# Patient Record
Sex: Female | Born: 1940 | Race: White | Hispanic: No | Smoking: Current every day smoker
Health system: Southern US, Community
[De-identification: ages and names within clinical notes are randomized; demographics above are authoritative.]

## PROBLEM LIST (undated history)

## (undated) DIAGNOSIS — F329 Major depressive disorder, single episode, unspecified: Secondary | ICD-10-CM

## (undated) DIAGNOSIS — M48061 Spinal stenosis, lumbar region without neurogenic claudication: Secondary | ICD-10-CM

## (undated) DIAGNOSIS — J449 Chronic obstructive pulmonary disease, unspecified: Secondary | ICD-10-CM

## (undated) DIAGNOSIS — G4730 Sleep apnea, unspecified: Secondary | ICD-10-CM

## (undated) DIAGNOSIS — M5126 Other intervertebral disc displacement, lumbar region: Secondary | ICD-10-CM

## (undated) DIAGNOSIS — M1990 Unspecified osteoarthritis, unspecified site: Secondary | ICD-10-CM

## (undated) DIAGNOSIS — J189 Pneumonia, unspecified organism: Secondary | ICD-10-CM

## (undated) DIAGNOSIS — D649 Anemia, unspecified: Secondary | ICD-10-CM

## (undated) DIAGNOSIS — K219 Gastro-esophageal reflux disease without esophagitis: Secondary | ICD-10-CM

## (undated) DIAGNOSIS — J45909 Unspecified asthma, uncomplicated: Secondary | ICD-10-CM

## (undated) DIAGNOSIS — F32A Depression, unspecified: Secondary | ICD-10-CM

## (undated) DIAGNOSIS — R42 Dizziness and giddiness: Secondary | ICD-10-CM

## (undated) DIAGNOSIS — S82891D Other fracture of right lower leg, subsequent encounter for closed fracture with routine healing: Secondary | ICD-10-CM

## (undated) DIAGNOSIS — F419 Anxiety disorder, unspecified: Secondary | ICD-10-CM

## (undated) DIAGNOSIS — F17200 Nicotine dependence, unspecified, uncomplicated: Secondary | ICD-10-CM

## (undated) DIAGNOSIS — F1020 Alcohol dependence, uncomplicated: Secondary | ICD-10-CM

## (undated) DIAGNOSIS — R270 Ataxia, unspecified: Secondary | ICD-10-CM

## (undated) HISTORY — DX: Nicotine dependence, unspecified, uncomplicated: F17.200

## (undated) HISTORY — PX: FOOT SURGERY: SHX648

## (undated) HISTORY — PX: BLADDER SURGERY: SHX569

## (undated) HISTORY — DX: Major depressive disorder, single episode, unspecified: F32.9

## (undated) HISTORY — DX: Ataxia, unspecified: R27.0

## (undated) HISTORY — DX: Unspecified osteoarthritis, unspecified site: M19.90

## (undated) HISTORY — DX: Dizziness and giddiness: R42

## (undated) HISTORY — PX: SPINAL FUSION: SHX223

## (undated) HISTORY — DX: Sleep apnea, unspecified: G47.30

## (undated) HISTORY — PX: ABDOMINAL HYSTERECTOMY: SHX81

## (undated) HISTORY — DX: Anxiety disorder, unspecified: F41.9

## (undated) HISTORY — DX: Gastro-esophageal reflux disease without esophagitis: K21.9

## (undated) HISTORY — DX: Chronic obstructive pulmonary disease, unspecified: J44.9

## (undated) HISTORY — DX: Other fracture of right lower leg, subsequent encounter for closed fracture with routine healing: S82.891D

## (undated) HISTORY — DX: Alcohol dependence, uncomplicated: F10.20

## (undated) HISTORY — PX: BACK SURGERY: SHX140

## (undated) HISTORY — DX: Depression, unspecified: F32.A

## (undated) HISTORY — PX: CERVICAL SPINE SURGERY: SHX589

---

## 2000-10-15 ENCOUNTER — Inpatient Hospital Stay (HOSPITAL_COMMUNITY): Admission: AD | Admit: 2000-10-15 | Discharge: 2000-10-16 | Payer: Self-pay | Admitting: Orthopaedic Surgery

## 2001-03-23 ENCOUNTER — Inpatient Hospital Stay (HOSPITAL_COMMUNITY): Admission: RE | Admit: 2001-03-23 | Discharge: 2001-03-24 | Payer: Self-pay | Admitting: Orthopaedic Surgery

## 2006-09-01 ENCOUNTER — Ambulatory Visit (HOSPITAL_COMMUNITY): Admission: RE | Admit: 2006-09-01 | Discharge: 2006-09-02 | Payer: Self-pay | Admitting: Orthopaedic Surgery

## 2007-10-24 IMAGING — RF DG CERVICAL SPINE 2 OR 3 VIEWS
1 series · 2 of 2 positions shown · non-contrast
Comparison: none

CLINICAL DATA: Anterior cervical spine fusion.
CERVICAL SPINE ? 2 VIEWS:

[Series 1: run · 2 of 2 slices shown]
[im 1/2]
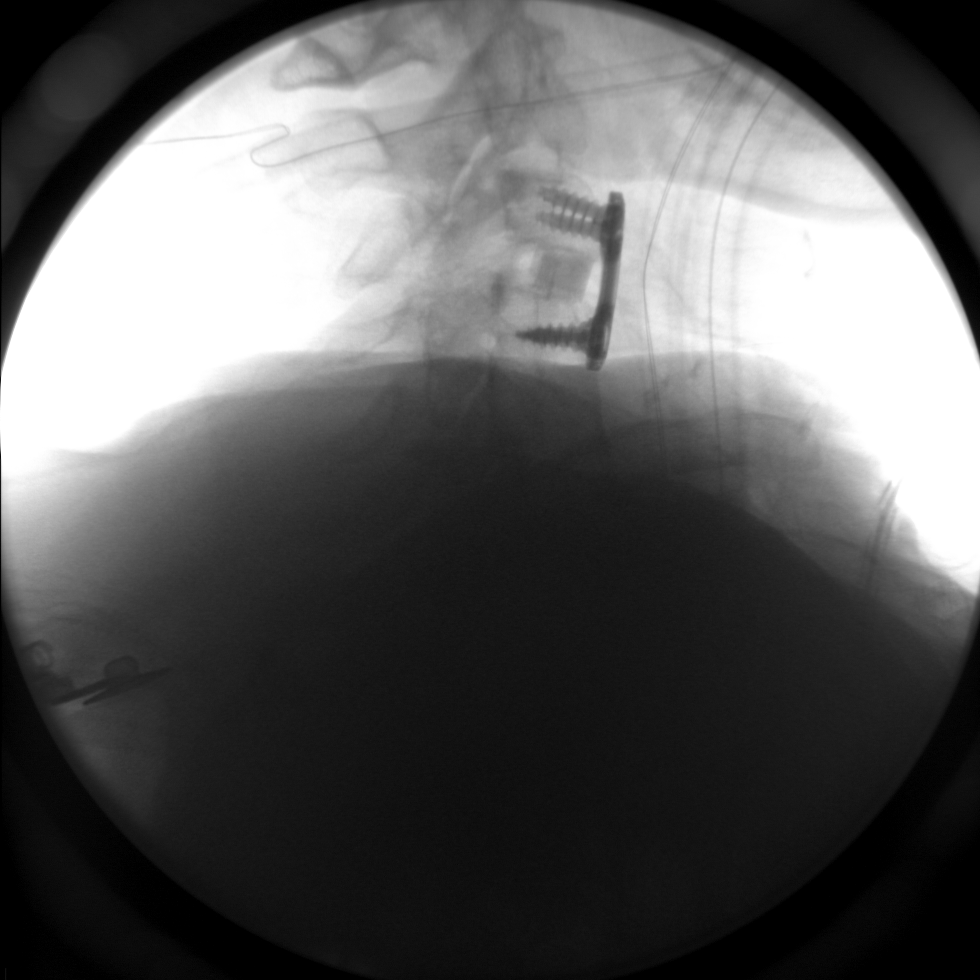
[im 2/2]
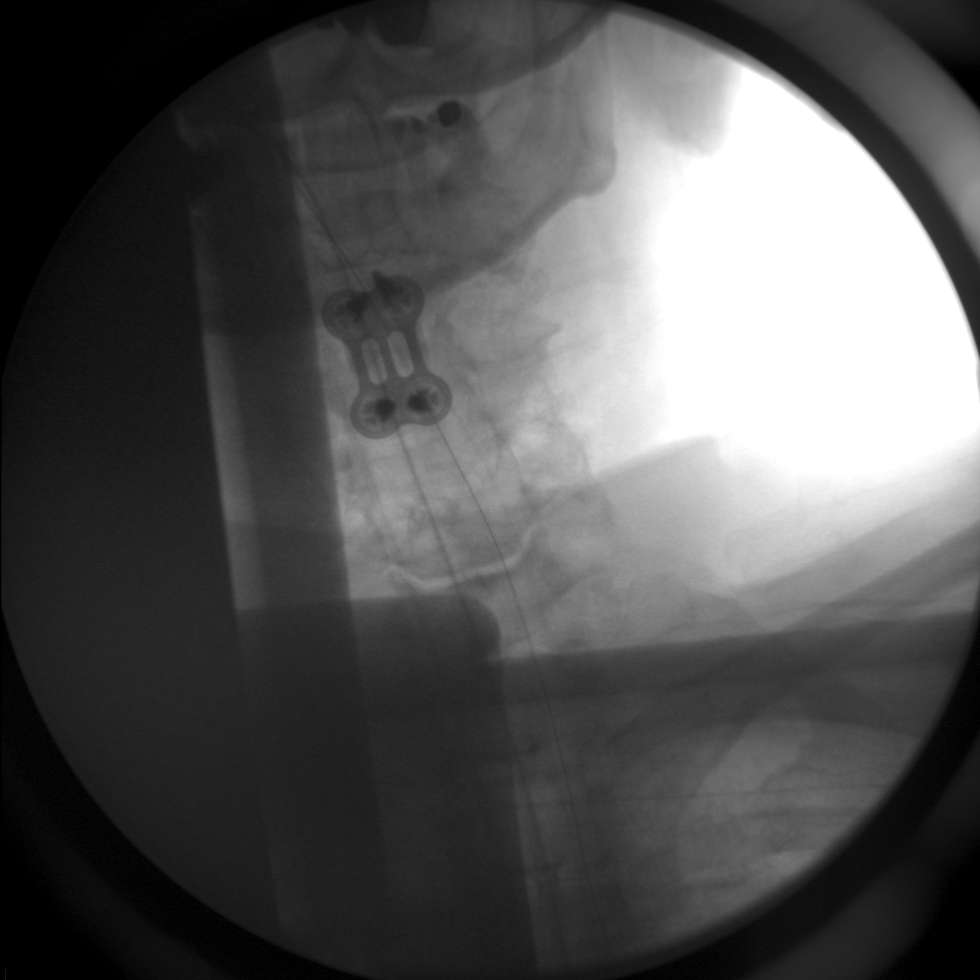

[2 of 2 positions shown; findings below may reference images not displayed]

FINDINGS: Two C-arm spot films of the cervical spine were obtained.  Anterior fusion has been performed at C3-4 with interbody fusion plug in good position.
IMPRESSION: Anterior fusion at C3-4.

## 2012-07-13 ENCOUNTER — Other Ambulatory Visit (HOSPITAL_BASED_OUTPATIENT_CLINIC_OR_DEPARTMENT_OTHER): Payer: Self-pay | Admitting: Orthopaedic Surgery

## 2012-07-13 DIAGNOSIS — M4802 Spinal stenosis, cervical region: Secondary | ICD-10-CM

## 2012-07-18 ENCOUNTER — Other Ambulatory Visit (HOSPITAL_BASED_OUTPATIENT_CLINIC_OR_DEPARTMENT_OTHER): Payer: Self-pay

## 2012-08-11 ENCOUNTER — Other Ambulatory Visit: Payer: Self-pay | Admitting: Orthopaedic Surgery

## 2012-08-11 DIAGNOSIS — R29898 Other symptoms and signs involving the musculoskeletal system: Secondary | ICD-10-CM

## 2012-08-11 DIAGNOSIS — M545 Low back pain: Secondary | ICD-10-CM

## 2012-08-12 ENCOUNTER — Other Ambulatory Visit: Payer: Self-pay

## 2012-08-16 ENCOUNTER — Ambulatory Visit
Admission: RE | Admit: 2012-08-16 | Discharge: 2012-08-16 | Disposition: A | Discharge status: Home / Self Care | Payer: Medicare Other | Source: Ambulatory Visit | Attending: Orthopaedic Surgery | Admitting: Orthopaedic Surgery

## 2012-08-16 DIAGNOSIS — R29898 Other symptoms and signs involving the musculoskeletal system: Secondary | ICD-10-CM

## 2012-08-16 DIAGNOSIS — M545 Low back pain: Secondary | ICD-10-CM

## 2012-08-16 MED ORDER — GADOBENATE DIMEGLUMINE 529 MG/ML IV SOLN
14.0000 mL | Freq: Once | INTRAVENOUS | Status: AC | PRN
  Administered 2012-08-16: 14 mL via INTRAVENOUS

## 2013-06-15 ENCOUNTER — Other Ambulatory Visit: Payer: Self-pay | Admitting: Orthopaedic Surgery

## 2013-06-15 DIAGNOSIS — M545 Low back pain: Secondary | ICD-10-CM

## 2013-06-15 DIAGNOSIS — M4807 Spinal stenosis, lumbosacral region: Secondary | ICD-10-CM

## 2013-06-24 ENCOUNTER — Other Ambulatory Visit: Payer: Medicare Other

## 2013-06-26 ENCOUNTER — Ambulatory Visit
Admission: RE | Admit: 2013-06-26 | Discharge: 2013-06-26 | Disposition: A | Discharge status: Home / Self Care | Payer: Medicare Other | Source: Ambulatory Visit | Attending: Orthopaedic Surgery | Admitting: Orthopaedic Surgery

## 2013-06-26 DIAGNOSIS — M4807 Spinal stenosis, lumbosacral region: Secondary | ICD-10-CM

## 2013-06-26 DIAGNOSIS — M545 Low back pain: Secondary | ICD-10-CM

## 2013-06-29 ENCOUNTER — Other Ambulatory Visit: Payer: Self-pay | Admitting: Orthopaedic Surgery

## 2013-06-30 ENCOUNTER — Other Ambulatory Visit (HOSPITAL_COMMUNITY): Payer: Self-pay | Admitting: Orthopaedic Surgery

## 2013-06-30 DIAGNOSIS — M79604 Pain in right leg: Secondary | ICD-10-CM

## 2013-06-30 DIAGNOSIS — I83893 Varicose veins of bilateral lower extremities with other complications: Secondary | ICD-10-CM

## 2013-07-01 ENCOUNTER — Ambulatory Visit (HOSPITAL_COMMUNITY)
Admission: RE | Admit: 2013-07-01 | Discharge: 2013-07-01 | Disposition: A | Discharge status: Home / Self Care | Payer: Medicare Other | Source: Ambulatory Visit | Attending: Orthopaedic Surgery | Admitting: Orthopaedic Surgery

## 2013-07-01 ENCOUNTER — Other Ambulatory Visit (HOSPITAL_COMMUNITY): Payer: Self-pay | Admitting: Orthopaedic Surgery

## 2013-07-01 DIAGNOSIS — M79604 Pain in right leg: Secondary | ICD-10-CM

## 2013-07-01 DIAGNOSIS — M7989 Other specified soft tissue disorders: Secondary | ICD-10-CM

## 2013-07-01 DIAGNOSIS — I83893 Varicose veins of bilateral lower extremities with other complications: Secondary | ICD-10-CM

## 2013-07-01 DIAGNOSIS — M79609 Pain in unspecified limb: Secondary | ICD-10-CM

## 2013-07-01 NOTE — Progress Notes (Signed)
VASCULAR LAB PRELIMINARY  ARTERIAL  ABI completed:WNL    RIGHT    LEFT    PRESSURE WAVEFORM  PRESSURE WAVEFORM  BRACHIAL 173 T BRACHIAL 173 T  DP   DP    AT 199  T AT 192  T  PT 185  T PT 191 T  PER   PER    GREAT TOE  NA GREAT TOE  NA    RIGHT LEFT  ABI >1.0 >1.0     Mahamud Metts, RVT 07/01/2013, 1:46 PM

## 2015-10-23 ENCOUNTER — Ambulatory Visit: Payer: 59

## 2015-11-15 ENCOUNTER — Ambulatory Visit: Payer: 59

## 2015-11-21 ENCOUNTER — Ambulatory Visit: Payer: 59

## 2017-12-09 ENCOUNTER — Ambulatory Visit: Payer: Medicare Other | Admitting: Neurology

## 2018-02-10 ENCOUNTER — Ambulatory Visit (INDEPENDENT_AMBULATORY_CARE_PROVIDER_SITE_OTHER): Payer: Medicare Other | Admitting: Orthopaedic Surgery

## 2018-02-10 ENCOUNTER — Encounter (INDEPENDENT_AMBULATORY_CARE_PROVIDER_SITE_OTHER): Payer: Self-pay | Admitting: Orthopaedic Surgery

## 2018-02-10 ENCOUNTER — Ambulatory Visit (INDEPENDENT_AMBULATORY_CARE_PROVIDER_SITE_OTHER): Payer: Medicare Other

## 2018-02-10 VITALS — BP 171/97 | HR 86 | Ht 65.25 in | Wt 154.0 lb

## 2018-02-10 DIAGNOSIS — M4807 Spinal stenosis, lumbosacral region: Secondary | ICD-10-CM

## 2018-02-10 DIAGNOSIS — G8929 Other chronic pain: Secondary | ICD-10-CM | POA: Diagnosis not present

## 2018-02-10 DIAGNOSIS — M545 Low back pain: Secondary | ICD-10-CM

## 2018-02-10 MED ORDER — DIAZEPAM 5 MG PO TABS: ORAL_TABLET | ORAL | 0 refills | Status: DC

## 2018-02-10 NOTE — Progress Notes (Signed)
Office Visit Note   Patient: Whitney Kelley           Date of Birth: 02-Nov-1940           MRN: [id number] Visit Date: 02/10/2018              Requested by: Chauncy Lean, PA-C [address] [place], [place] [address] PCP: Chauncy Lean, PA-C   Assessment & Plan: Visit Diagnoses:  1. Chronic bilateral low back pain, with sciatica presence unspecified   2. Spinal stenosis of lumbosacral region     Plan: Patient is having claudication symptoms with normal arterial exam.  In 5 to 10 minutes and will need lumbar MRI scan for evaluation.  MRI in the past showed L3-4 disc bulging and all other levels had already significantly collapsed.  Office follow-up after MRI scan.  Follow-Up Instructions: No follow-ups on file.   Orders:  Orders Placed This Encounter  Procedures  . XR Lumbar Spine 2-3 Views  . MR LUMBAR SPINE W WO CONTRAST   Meds ordered this encounter  Medications  . diazepam (VALIUM) 5 MG tablet    Sig: Take 2 tablets one hour prior to MRI and take the other with you to exam if needed.    Dispense:  3 tablet    Refill:  0      Procedures: No procedures performed   Clinical Data: No additional findings.   Subjective: Chief Complaint  Patient presents with  . Lower Back - Pain    HPI 77 year old female long-term patient last seen 2017 is here with problems with chronic low back pain.  She states is gradually getting worse she can stand very long has to sit after about 5 to 10 minutes and having trouble cooking.  She gets relief when she sits.  She states when she walks her legs are getting weak stumble.  No fever or chills.  States she had problems last year with alcohol states she is doing well with this currently.  Normal ABIs 2014 lower extremities.  Review of Systems as of COPD with long-term smoking history.  Positive for alcohol abuse in the past.  Depression, history of dizziness.  His falls.  GERD.  Previous cervical fusions.  14 point review of  systems otherwise negative as it pertains to HPI.   Objective: Vital Signs: BP (!) 171/97   Pulse 86   Ht 5' 5.25" (1.657 m)   Wt 154 lb (69.9 kg)   BMI 25.43 kg/m   Physical Exam  Constitutional: She is oriented to person, place, and time. She appears well-developed.  HENT:  Head: Normocephalic.  Right Ear: External ear normal.  Left Ear: External ear normal.  Eyes: Pupils are equal, round, and reactive to light.  Neck: No tracheal deviation present. No thyromegaly present.  Cardiovascular: Normal rate.  Pulmonary/Chest: Effort normal.  Abdominal: Soft.  Neurological: She is alert and oriented to person, place, and time.  Skin: Skin is warm and dry.  Psychiatric: She has a normal mood and affect. Her behavior is normal.    Ortho Exam patient pain with lumbar palpation sciatic notch.  Distal pulses are 2+ and symmetrical.  No pain with hip range of motion knees reach full extension no trochanteric bursal tenderness.  Sensation lower extremities are intact anterior tib EHL gastrocsoleus peroneals are strong.  No pitting edema. Specialty Comments:  No specialty comments available.  Imaging: Xr Lumbar Spine 2-3 Views  Result Date: 02/10/2018 AP lateral lumbar spine x-rays are  obtained and reviewed.  Gallbladder clips noted.  Mild lumbar curvature with pelvic obliquity.  Multilevel disc space narrowing in the lumbar region except for L3-4 level.  Facet arthropathy noted.  Negative for acute fracture. Impression: Lumbar disc degeneration multilevel.  No acute changes.    PMFS History: There are no active problems to display for this patient.  Past Medical History:  Diagnosis Date  . Acid reflux   . Alcoholism (HCC)   . Anxiety   . Arthritis   . Ataxia   . Closed fracture of right ankle with routine healing   . COPD (chronic obstructive pulmonary disease) (HCC)   . Depression   . Dizziness   . Major depressive disorder   . Sleep apnea   . Tobacco dependence     No  family history on file.  Past Surgical History:  Procedure Laterality Date  . ABDOMINAL HYSTERECTOMY    . BACK SURGERY    . BLADDER SURGERY     x3  . FOOT SURGERY    . SPINAL FUSION     cervical x 2   Social History   Occupational History  . Not on file  Tobacco Use  . Smoking status: Current Every Day Smoker  . Smokeless tobacco: Never Used  Substance and Sexual Activity  . Alcohol use: Not Currently  . Drug use: Not on file  . Sexual activity: Not on file

## 2018-02-18 ENCOUNTER — Ambulatory Visit
Admission: RE | Admit: 2018-02-18 | Discharge: 2018-02-18 | Disposition: A | Discharge status: Home / Self Care | Payer: Medicare Other | Source: Ambulatory Visit | Attending: Orthopaedic Surgery | Admitting: Orthopaedic Surgery

## 2018-02-18 DIAGNOSIS — M4807 Spinal stenosis, lumbosacral region: Secondary | ICD-10-CM

## 2018-02-18 MED ORDER — GADOBENATE DIMEGLUMINE 529 MG/ML IV SOLN
15.0000 mL | Freq: Once | INTRAVENOUS | Status: AC | PRN
  Administered 2018-02-18: 15 mL via INTRAVENOUS

## 2018-02-24 ENCOUNTER — Ambulatory Visit (INDEPENDENT_AMBULATORY_CARE_PROVIDER_SITE_OTHER): Payer: Medicare Other

## 2018-02-24 ENCOUNTER — Encounter (INDEPENDENT_AMBULATORY_CARE_PROVIDER_SITE_OTHER): Payer: Self-pay | Admitting: Orthopaedic Surgery

## 2018-02-24 ENCOUNTER — Ambulatory Visit (INDEPENDENT_AMBULATORY_CARE_PROVIDER_SITE_OTHER): Payer: Medicare Other | Admitting: Orthopaedic Surgery

## 2018-02-24 VITALS — BP 169/98 | HR 76 | Ht 65.5 in | Wt 154.0 lb

## 2018-02-24 DIAGNOSIS — G8929 Other chronic pain: Secondary | ICD-10-CM | POA: Diagnosis not present

## 2018-02-24 DIAGNOSIS — M545 Low back pain: Secondary | ICD-10-CM | POA: Diagnosis not present

## 2018-02-24 NOTE — Progress Notes (Signed)
Office Visit Note   Patient: Whitney Kelley           Date of Birth: 01/11/1941           MRN: [id number] Visit Date: 02/24/2018              Requested by: Chauncy LeanWatterson, Patrick, PA-C [address] [place], [place] [id number] PCP: Chauncy LeanWatterson, Patrick, PA-C   Assessment & Plan: Visit Diagnoses:  1. Chronic bilateral low back pain, with sciatica presence unspecified     Plan: Patient is moderate to severe stenosis at L3-4 multifactorial with large disc protrusion ligamentum hypertrophy and facet overgrowth.  She has some severe foraminal narrowing at L4-5 level which is progressed but is having neurogenic claudication symptoms and not radicular symptoms.  We discussed the multilevel degenerative changes and reviewed x-rays.  We discussed she is not likely to get any relief with an epidural since her symptoms are severe stenosis at the L3-4 level.  Plan would be single level decompression surgery.  She will need to make arrangements for someone she could stay with 4-day or 2 after the surgery.  Operative technique discussed questions elicited and answered.  She understands request to proceed.  We will have her obtain medical clearance preoperatively with her history of COPD.  MRI scan was reviewed with the patient questions were elicited and answered she request to proceed.  Copy of MRI report given to her.  Follow-Up Instructions: No follow-ups on file.   Orders:  Orders Placed This Encounter  Procedures  . XR Lumb Spine Flex&Ext Only   No orders of the defined types were placed in this encounter.     Procedures: No procedures performed   Clinical Data: No additional findings.   Subjective: Chief Complaint  Patient presents with  . Lower Back - Pain, Follow-up    MRI review    HPI 77 year old female returns with neurogenic claudication symptoms.  She can stand for 5 to 10 minutes and has to sit.  She does better leaning on a grocery cart.  She can walk 1.5 blocks without  stopping to sit.  Normal ABIs 2014.  Previous right L4-5 microdiscectomy in 2002.  She returns post MRI for evaluation of her claudication symptoms.  She has palpable pulses.  She is a long-term smoker.  Patient is taken anti-inflammatories without relief.  MRI results shows moderate to severe stenosis at L3-4 which is progressed since 2014.  She does have progression of foraminal stenosis at L4-5 on the right but has near complete disc space collapse at 4 5 and also 5 1 as well as the L2-3 level.  Patient lives by herself and states that this inhibits her ability to go to the grocery store, go out with friends to dinner, go to church etc.  Previous lumbar x-rays showed significant narrowing of all disc spaces except for the L3-4 level.  Endplate abutment at L2-3 and also L5-S1.  Greater than 50% narrowing at L4-5.  Review of Systems positive for long-term smoking history with COPD.  Positive history of alcohol abuse in the distant past.  Positive for depression history of dizziness.  Positive for GERD.  Positive C3-4 anterior cervical fusion.  14 point review of systems otherwise negative as it pertains HPI.   Objective: Vital Signs: BP (!) 169/98   Pulse 76   Ht 5' 5.5" (1.664 m)   Wt 154 lb (69.9 kg)   BMI 25.24 kg/m   Physical Exam  Constitutional: She is oriented  to person, place, and time. She appears well-developed.  HENT:  Head: Normocephalic.  Right Ear: External ear normal.  Left Ear: External ear normal.  Eyes: Pupils are equal, round, and reactive to light.  Neck: No tracheal deviation present. No thyromegaly present.  Healed anterior cervical incision good range of motion cervical spine.  Negative Spurling.  Cardiovascular: Normal rate.  Pulmonary/Chest: Effort normal.  Abdominal: Soft.  Neurological: She is alert and oriented to person, place, and time.  Skin: Skin is warm and dry.  Psychiatric: She has a normal mood and affect. Her behavior is normal.    Ortho Exam  well-healed lumbar midline incision at L4-5 level.  Patient has some discomfort with bilateral sciatic notch palpation negative straight leg raising 90 degrees normal hip range of motion.  Quads hamstrings anterior tib EHL gastrocsoleus is intact.  Lower extremity reflexes are 2+ and symmetrical.  Pedal pulses are palpable no pitting edema no venous stasis changes. Specialty Comments:  No specialty comments available.  Imaging: CLINICAL DATA:  77 year old female with chronic lumbar back pain, weakness in both legs. Prior surgery.  Creatinine was obtained on site at [facility] at [address]  Results: Creatinine 0.7 mg/dL.  EXAM: MRI LUMBAR SPINE WITHOUT AND WITH CONTRAST  TECHNIQUE: Multiplanar and multiecho pulse sequences of the lumbar spine were obtained without and with intravenous contrast.  CONTRAST:  15mL MULTIHANCE GADOBENATE DIMEGLUMINE 529 MG/ML IV SOLN  COMPARISON:  CT Abdomen and Pelvis 04/25/2016. Lumbar MRI 06/26/2013.  FINDINGS: Segmentation: Normal on the comparison CT, which is the same numbering system used on the 2014 MRI.  Alignment: Stable vertebral height and alignment since 2014, including chronic retrolisthesis of L2 on L3 and L5 on S1.  Vertebrae: No marrow edema or evidence of acute osseous abnormality. Visualized bone marrow signal is within normal limits. Intact visible sacrum and SI joints.  Conus medullaris and cauda equina: Conus extends to the L2 level. No lower spinal cord or conus signal abnormality. No abnormal intradural enhancement.  Paraspinal and other soft tissues: Negative visualized abdominal viscera. Some posterior paraspinal muscle atrophy is evident since 2014. Otherwise negative paraspinal soft tissues.  Disc levels:  T11-T12: Disc space loss since 2014 with increased bulky circumferential disc bulge. Superimposed right paracentral disc protrusion is new on series 6, image 3 since the prior  MRI. Superimposed moderate facet hypertrophy. New mild spinal and moderate right lateral recess stenosis (descending right T12 nerve level). Increased mild bilateral T11 foraminal stenosis.  T12-L1:  Mild facet hypertrophy.  L1-L2: Stable mild disc bulging and facet hypertrophy. No stenosis.  L2-L3: Chronic retrolisthesis and severe disc space loss with left eccentric circumferential disc osteophyte complex. Stable mild spinal and left greater than right lateral recess stenosis (descending L3 nerve levels). Stable mild L2 foraminal stenosis.  L3-L4: Chronic right eccentric broad-based posterior disc protrusion. Mild to moderate facet and ligament flavum hypertrophy. Epidural lipomatosis. Moderate to severe spinal stenosis (series 9, image 6). Mild to moderate right greater than left lateral recess and foraminal stenosis (descending and exiting right L3 nerve levels). This level is stable to mildly progressed.  L4-L5: Chronic postoperative changes to the right lamina. Chronic disc space loss and circumferential disc bulge with endplate spurring eccentric to the right. Moderate residual left facet and ligament flavum hypertrophy. Trace degenerative facet joint fluid is bilateral now. No spinal stenosis. Architectural distortion at the right lateral recess without lateral recess stenosis. Chronic moderate to severe right L4 neural foraminal stenosis (series 9, image 3).  This level is stable to mildly progressed.  L5-S1: Chronic retrolisthesis and severe disc space loss with circumferential disc osteophyte complex. Chronic moderate facet hypertrophy. No spinal or lateral recess stenosis. Chronic moderate to severe left and mild right L5 foraminal stenosis. This level is stable.  IMPRESSION: 1. Mild progression of chronic spinal degeneration at L3-L4 and L4-L5 since the 2014 MRI. - moderate to severe spinal stenosis at L3-L4 with lateral recess and foraminal stenosis greater  on the right. - moderate to severe right L4 foraminal stenosis at L4-L5. 2. Other lumbar levels are stable, including mild retrolisthesis with severe chronic disc and endplate degeneration at both L2-L3 and L5-S1. Associated chronic moderate to severe left L5 foraminal stenosis. 3. Progressed lower thoracic spine disc degeneration at T11-T12 since 2014. New mild spinal stenosis at that level in part due to moderate right lateral recess disc protrusion.   Electronically Signed   By: Odessa Fleming M.D.   On: 02/18/2018 10:06   PMFS History: There are no active problems to display for this patient.  Past Medical History:  Diagnosis Date  . Acid reflux   . Alcoholism (HCC)   . Anxiety   . Arthritis   . Ataxia   . Closed fracture of right ankle with routine healing   . COPD (chronic obstructive pulmonary disease) (HCC)   . Depression   . Dizziness   . Major depressive disorder   . Sleep apnea   . Tobacco dependence     No family history on file.  Past Surgical History:  Procedure Laterality Date  . ABDOMINAL HYSTERECTOMY    . BACK SURGERY    . BLADDER SURGERY     x3  . FOOT SURGERY    . SPINAL FUSION     cervical x 2   Social History   Occupational History  . Not on file  Tobacco Use  . Smoking status: Current Every Day Smoker  . Smokeless tobacco: Never Used  Substance and Sexual Activity  . Alcohol use: Not Currently  . Drug use: Not on file  . Sexual activity: Not on file

## 2018-03-11 ENCOUNTER — Telehealth (INDEPENDENT_AMBULATORY_CARE_PROVIDER_SITE_OTHER): Payer: Self-pay | Admitting: Orthopaedic Surgery

## 2018-03-11 NOTE — Telephone Encounter (Signed)
I called has stenosis, pain meds will not fix problem. She can sit when she gets claudication Sx to get relief.waiting to schedule surgery.  FYI

## 2018-03-11 NOTE — Telephone Encounter (Signed)
Patient called requesting a refill on her pain medication, she's experiencing a lot of pain. CB # [phone]

## 2018-03-11 NOTE — Telephone Encounter (Signed)
Please advise. I do not see where you prescribed pain medication in the chart. Patient is currently waiting for medical clearance to have surgery.

## 2018-03-18 ENCOUNTER — Encounter (INDEPENDENT_AMBULATORY_CARE_PROVIDER_SITE_OTHER): Payer: Self-pay | Admitting: Surgery

## 2018-03-18 ENCOUNTER — Ambulatory Visit (INDEPENDENT_AMBULATORY_CARE_PROVIDER_SITE_OTHER): Payer: Medicare Other | Admitting: Surgery

## 2018-03-18 VITALS — BP 159/104 | HR 98 | Temp 99.3°F

## 2018-03-18 DIAGNOSIS — M4807 Spinal stenosis, lumbosacral region: Secondary | ICD-10-CM | POA: Diagnosis not present

## 2018-03-18 NOTE — Progress Notes (Signed)
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  We have received preop medical/cardiac clearance from Dr. Selina CooleyBobby Witten.  Today in the clinic patient has a low-grade temp 99.3.  States that she has had some chills along with a productive cough with cream-colored sputum.  She did not have the chills or the productive cough when she was seen by her primary care physician for clearance.  History of COPD.  We will see if patient can get an appointment to see her primary care physician for recheck and they can decide if patient needs to be on an antibiotic preop.  She goes for her preop work-up July 3.

## 2018-03-18 NOTE — H&P (Signed)
Whitney Kelley is an 77 y.o. female.   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  Progressive worsening symptoms.  Failed conservative treatment.  We have received preop medical clearance from her primary care physician Dr. Selina CooleyBobby Witten.  Patient states that over the last couple weeks or so she is been having some chills along with productive cough with cream-colored sputum.  She is not having these issues at the time of her medical clearance.  Admits to having chronic exertional dyspnea.  Patient has a history of nicotine abuse and COPD.  Temp 99.3 in the office today.  Past Medical History:  Diagnosis Date  . Acid reflux   . Alcoholism (HCC)   . Anxiety   . Arthritis   . Ataxia   . Closed fracture of right ankle with routine healing   . COPD (chronic obstructive pulmonary disease) (HCC)   . Depression   . Dizziness   . Major depressive disorder   . Sleep apnea   . Tobacco dependence     Past Surgical History:  Procedure Laterality Date  . ABDOMINAL HYSTERECTOMY    . BACK SURGERY    . BLADDER SURGERY     x3  . FOOT SURGERY    . SPINAL FUSION     cervical x 2    No family history on file. Social History:  reports that she has been smoking.  She has never used smokeless tobacco. She reports that she drank alcohol. Her drug history is not on file.  Allergies:  Allergies  Allergen Reactions  . Atorvastatin Other (See Comments)    Patient uncertain of reaction Unknown.   Marland Kitchen. Hydrocodone-Acetaminophen Other (See Comments)    insomnia  . Amoxicillin-Pot Clavulanate Other (See Comments) and Rash    rash   . Diclofenac-Misoprostol Diarrhea and Nausea Only  . Prednisone Nausea And Vomiting    No medications prior to admission.    No results found for this or any previous visit (from the past 48 hour(s)). No results found.  Review of Systems   Constitutional: Positive for chills.  HENT: Negative.   Eyes: Negative.   Respiratory: Positive for cough, sputum production (Cream-colored) and shortness of breath. Negative for hemoptysis.   Cardiovascular: Negative.   Gastrointestinal: Negative.   Genitourinary: Negative.   Musculoskeletal: Positive for back pain.  Neurological: Positive for tingling.  Psychiatric/Behavioral: Negative.     There were no vitals taken for this visit. Physical Exam  Constitutional: She is oriented to person, place, and time. No distress.  HENT:  Head: Normocephalic.  Eyes: Pupils are equal, round, and reactive to light. EOM are normal.  Neck: Normal range of motion.  Cardiovascular: Normal heart sounds.  Respiratory:  Scattered bilateral rhonchi.  Few scattered wheezes.  GI: Soft. She exhibits no distension. There is no tenderness.  Musculoskeletal: She exhibits tenderness.  Neurological: She is alert and oriented to person, place, and time.  Skin: Skin is warm and dry.  Psychiatric: She has a normal mood and affect.     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  Surgical procedure along with possible rehab/recovery time discussed.  All questions answered.  With patient's low-grade temp and complaints of chills and productive cough we were able to help get her an appointment to see her primary  care physician this afternoon for evaluation.   Zonia Kief, PA-C 03/18/2018, 11:44 AM

## 2018-03-24 NOTE — Pre-Procedure Instructions (Signed)
Whitney Kelley  03/24/2018      [facility] - [place], [place] - [id number] [place] [id number] Phone: [phone] Fax: [phone]    Your procedure is scheduled on July 10  [facility] Ad[facility] at 1:15 P.M.  Call this number if you have problems the morning of surgery:  [phone]   Remember:  Do not eat or drink after midnight.     Take these medicines the morning of surgery with A SIP OF WATER  albuterol (PROVENTIL HFA;VENTOLIN HFA) budesonide-formoterol (SYMBICORT) levofloxacin (LEVAQUIN) ranitidine (ZANTAC) venlafaxine XR (EFFEXOR-XR)   7 days prior to surgery STOP taking any Aspirin(unless otherwise instructed by your surgeon), Aleve, Naproxen, Ibuprofen, Motrin, Advil, Goody's, BC's, all herbal medications, fish oil, and all vitamins     Do not wear jewelry, make-up or nail polish.  Do not wear lotions, powders, or perfumes, or deodorant.  Do not shave 48 hours prior to surgery.    Do not bring valuables to the hospital.  [facility] is not responsible for any belongings or valuables.  Contacts, dentures or bridgework may not be worn into surgery.  Leave your suitcase in the car.  After surgery it may be brought to your room.  For patients admitted to the hospital, discharge time will be determined by your treatment team.  Patients discharged the day of surgery will not be allowed to drive home.    Special instructions:   [facility]- Preparing For Surgery  Before surgery, you can play an important role. Because skin is not sterile, your skin needs to be as free of germs as possible. You can reduce the number of germs on your skin by washing with CHG (chlorahexidine gluconate) Soap before surgery.  CHG is an antiseptic cleaner which kills germs and bonds with the skin to continue killing germs even after washing.    Oral Hygiene is also important to reduce your risk of infection.  Remember - BRUSH  YOUR TEETH THE MORNING OF SURGERY WITH YOUR REGULAR TOOTHPASTE  Please do not use if you have an allergy to CHG or antibacterial soaps. If your skin becomes reddened/irritated stop using the CHG.  Do not shave (including legs and underarms) for at least 48 hours prior to first CHG shower. It is OK to shave your face.  Please follow these instructions carefully.   1. Shower the NIGHT BEFORE SURGERY and the MORNING OF SURGERY with CHG.   2. If you chose to wash your hair, wash your hair first as usual with your normal shampoo.  3. After you shampoo, rinse your hair and body thoroughly to remove the shampoo.  4. Use CHG as you would any other liquid soap. You can apply CHG directly to the skin and wash gently with a scrungie or a clean washcloth.   5. Apply the CHG Soap to your body ONLY FROM THE NECK DOWN.  Do not use on open wounds or open sores. Avoid contact with your eyes, ears, mouth and genitals (private parts). Wash Face and genitals (private parts)  with your normal soap.  6. Wash thoroughly, paying special attention to the area where your surgery will be performed.  7. Thoroughly rinse your body with warm water from the neck down.  8. DO NOT shower/wash with your normal soap after using and rinsing off the CHG Soap.  9. Pat yourself dry with a CLEAN TOWEL.  10. Wear CLEAN PAJAMAS to bed  the night before surgery, wear comfortable clothes the morning of surgery  11. Place CLEAN SHEETS on your bed the night of your first shower and DO NOT SLEEP WITH PETS.    Day of Surgery:  Do not apply any deodorants/lotions.  Please wear clean clothes to the hospital/surgery center.   Remember to brush your teeth WITH YOUR REGULAR TOOTHPASTE.    Please read over the following fact sheets that you were given.

## 2018-03-25 ENCOUNTER — Encounter (HOSPITAL_COMMUNITY)
Admission: RE | Admit: 2018-03-25 | Discharge: 2018-03-25 | Disposition: A | Discharge status: Home / Self Care | Payer: Medicare Other | Source: Ambulatory Visit | Attending: Orthopaedic Surgery | Admitting: Orthopaedic Surgery

## 2018-03-25 ENCOUNTER — Encounter (HOSPITAL_COMMUNITY): Payer: Self-pay

## 2018-03-25 ENCOUNTER — Other Ambulatory Visit: Payer: Self-pay

## 2018-03-25 ENCOUNTER — Encounter (HOSPITAL_COMMUNITY)
Admission: RE | Admit: 2018-03-25 | Discharge: 2018-03-25 | Disposition: A | Discharge status: Home / Self Care | Payer: Medicare Other | Source: Ambulatory Visit | Attending: Surgery | Admitting: Surgery

## 2018-03-25 DIAGNOSIS — Z01811 Encounter for preprocedural respiratory examination: Secondary | ICD-10-CM | POA: Insufficient documentation

## 2018-03-25 DIAGNOSIS — Z01812 Encounter for preprocedural laboratory examination: Secondary | ICD-10-CM | POA: Insufficient documentation

## 2018-03-25 DIAGNOSIS — J449 Chronic obstructive pulmonary disease, unspecified: Secondary | ICD-10-CM | POA: Diagnosis not present

## 2018-03-25 DIAGNOSIS — F17200 Nicotine dependence, unspecified, uncomplicated: Secondary | ICD-10-CM | POA: Insufficient documentation

## 2018-03-25 DIAGNOSIS — I700 Atherosclerosis of aorta: Secondary | ICD-10-CM | POA: Diagnosis not present

## 2018-03-25 HISTORY — DX: Other intervertebral disc displacement, lumbar region: M51.26

## 2018-03-25 HISTORY — DX: Pneumonia, unspecified organism: J18.9

## 2018-03-25 HISTORY — DX: Unspecified asthma, uncomplicated: J45.909

## 2018-03-25 HISTORY — DX: Anemia, unspecified: D64.9

## 2018-03-25 HISTORY — DX: Spinal stenosis, lumbar region without neurogenic claudication: M48.061

## 2018-03-25 LAB — URINALYSIS, ROUTINE W REFLEX MICROSCOPIC
BILIRUBIN URINE: NEGATIVE
Glucose, UA: NEGATIVE mg/dL
KETONES UR: NEGATIVE mg/dL
Leukocytes, UA: NEGATIVE
Nitrite: NEGATIVE
PROTEIN: NEGATIVE mg/dL
Specific Gravity, Urine: 1.004 — ABNORMAL LOW (ref 1.005–1.030)
pH: 6 (ref 5.0–8.0)

## 2018-03-25 LAB — SURGICAL PCR SCREEN
MRSA, PCR: NEGATIVE
Staphylococcus aureus: NEGATIVE

## 2018-03-25 NOTE — Progress Notes (Signed)
Whitney Kelley            03/24/2018                          [facility] - [place] - [id number] [place] [id number] Phone: [phone] Fax: [phone]              Your procedure is scheduled on Mon., April 13, 2018 from 3:16PM-5:16PM            [facility] Admitting  Entrance "A" at 1:15PM            Call this number if you have problems the morning of surgery:            [phone]             Remember:            Do not eat or drink after midnight on July 21st                         Take these medicines the morning of surgery with A SIP OF WATER  albuterol (PROVENTIL HFA;VENTOLIN HFA) budesonide-formoterol (SYMBICORT) levofloxacin (LEVAQUIN) ranitidine (ZANTAC) venlafaxine XR (EFFEXOR-XR)   7 days prior to surgery STOP taking any Aspirin(unless otherwise instructed by your surgeon), Aleve, Naproxen, Ibuprofen, Motrin, Advil, Goody's, BC's, all herbal medications, fish oil, and all vitamins                         Do not wear jewelry, make-up or nail polish.            Do not wear lotions, powders, or perfumes, or deodorant.            Do not shave 48 hours prior to surgery.              Do not bring valuables to the hospital.            [facility] is not responsible for any belongings or valuables.  Contacts, dentures or bridgework may not be worn into surgery.  Leave your suitcase in the car.  After surgery it may be brought to your room.  For patients admitted to the hospital, discharge time will be determined by your treatment team.  Patients discharged the day of surgery will not be allowed to drive home.    Special instructions:   [place]- Preparing For Surgery  Before surgery, you can play an important role. Because skin is not sterile, your skin needs to be as free of germs as possible. You can reduce the number of germs on your skin by washing with CHG (chlorahexidine  gluconate) Soap before surgery.  CHG is an antiseptic cleaner which kills germs and bonds with the skin to continue killing germs even after washing.    Oral Hygiene is also important to reduce your risk of infection.  Remember - BRUSH YOUR TEETH THE MORNING OF SURGERY WITH YOUR REGULAR TOOTHPASTE  Please do not use if you have an allergy to CHG or antibacterial soaps. If your skin becomes reddened/irritated stop using the CHG.  Do not shave (including legs and underarms) for at least 48 hours prior to first CHG shower. It is OK to shave your face.  Please follow these instructions carefully.  1. Shower the NIGHT BEFORE SURGERY and the MORNING OF SURGERY with CHG.   2. If you chose to wash your hair, wash your hair first as usual with your normal shampoo.  3. After you shampoo, rinse your hair and body thoroughly to remove the shampoo.  4. Use CHG as you would any other liquid soap. You can apply CHG directly to the skin and wash gently with a scrungie or a clean washcloth.   5. Apply the CHG Soap to your body ONLY FROM THE NECK DOWN.  Do not use on open wounds or open sores. Avoid contact with your eyes, ears, mouth and genitals (private parts). Wash Face and genitals (private parts)  with your normal soap.  6. Wash thoroughly, paying special attention to the area where your surgery will be performed.  7. Thoroughly rinse your body with warm water from the neck down.  8. DO NOT shower/wash with your normal soap after using and rinsing off the CHG Soap.  9. Pat yourself dry with a CLEAN TOWEL.  10. Wear CLEAN PAJAMAS to bed the night before surgery, wear comfortable clothes the morning of surgery  11. Place CLEAN SHEETS on your bed the night of your first shower and DO NOT SLEEP WITH PETS.    Day of Surgery:  Do not apply any deodorants/lotions.   Please wear clean clothes to the hospital/surgery center.   Remember to brush your teeth WITH YOUR REGULAR TOOTHPASTE.    Please read over the following fact sheets that you were given.

## 2018-03-25 NOTE — Progress Notes (Addendum)
PCP - Dr. Selina CooleyBobby Kelley- [facility]  Cardiologist - Denies  Chest x-ray - Denies  EKG - 03/06/18 (CE)-Req'd  Stress Test - Denies  ECHO - Denies  Cardiac Cath - Denies  Sleep Study - Yes-Positive CPAP - None  LABS- 03/25/18: UA 04/09/18: CBC w/D, CMP, PT, PTT  ASA- Denies  Pt's surgery date changed from 7/10 until 7/22. Pt made aware. Labs cancelled today. Pt will return on 7/18 at 9am to have labs redrawn. Pt given the number to Dr. Ophelia CharterYates' office to reconfirm surgery date, and to make sure she has transportation the day of surgery.   Anesthesia- Yes- Req'd EKG  Pt denies having chest pain, sob, or fever at this time. All instructions explained to the pt, with a verbal understanding of the material. Pt agrees to go over the instructions while at home for a better understanding. The opportunity to ask questions was provided.

## 2018-04-08 ENCOUNTER — Inpatient Hospital Stay (INDEPENDENT_AMBULATORY_CARE_PROVIDER_SITE_OTHER): Payer: Medicare Other | Admitting: Orthopaedic Surgery

## 2018-04-09 ENCOUNTER — Encounter (HOSPITAL_COMMUNITY)
Admission: RE | Admit: 2018-04-09 | Discharge: 2018-04-09 | Disposition: A | Discharge status: Home / Self Care | Payer: Medicare Other | Source: Ambulatory Visit | Attending: Orthopaedic Surgery | Admitting: Orthopaedic Surgery

## 2018-04-09 ENCOUNTER — Other Ambulatory Visit (INDEPENDENT_AMBULATORY_CARE_PROVIDER_SITE_OTHER): Payer: Self-pay

## 2018-04-09 DIAGNOSIS — F17200 Nicotine dependence, unspecified, uncomplicated: Secondary | ICD-10-CM | POA: Insufficient documentation

## 2018-04-09 DIAGNOSIS — M48061 Spinal stenosis, lumbar region without neurogenic claudication: Secondary | ICD-10-CM | POA: Diagnosis not present

## 2018-04-09 DIAGNOSIS — M5116 Intervertebral disc disorders with radiculopathy, lumbar region: Secondary | ICD-10-CM | POA: Diagnosis not present

## 2018-04-09 DIAGNOSIS — J449 Chronic obstructive pulmonary disease, unspecified: Secondary | ICD-10-CM | POA: Insufficient documentation

## 2018-04-09 DIAGNOSIS — Z888 Allergy status to other drugs, medicaments and biological substances status: Secondary | ICD-10-CM | POA: Insufficient documentation

## 2018-04-09 DIAGNOSIS — Z981 Arthrodesis status: Secondary | ICD-10-CM | POA: Diagnosis not present

## 2018-04-09 DIAGNOSIS — Z880 Allergy status to penicillin: Secondary | ICD-10-CM | POA: Diagnosis not present

## 2018-04-09 DIAGNOSIS — Z90710 Acquired absence of both cervix and uterus: Secondary | ICD-10-CM | POA: Insufficient documentation

## 2018-04-09 DIAGNOSIS — Z01812 Encounter for preprocedural laboratory examination: Secondary | ICD-10-CM | POA: Insufficient documentation

## 2018-04-09 DIAGNOSIS — Z885 Allergy status to narcotic agent status: Secondary | ICD-10-CM | POA: Insufficient documentation

## 2018-04-09 DIAGNOSIS — G4730 Sleep apnea, unspecified: Secondary | ICD-10-CM | POA: Insufficient documentation

## 2018-04-09 LAB — CBC WITH DIFFERENTIAL/PLATELET
Abs Immature Granulocytes: 0.1 10*3/uL (ref 0.0–0.1)
BASOS PCT: 1 %
Basophils Absolute: 0.1 10*3/uL (ref 0.0–0.1)
EOS ABS: 0.1 10*3/uL (ref 0.0–0.7)
Eosinophils Relative: 1 %
HEMATOCRIT: 46.9 % — AB (ref 36.0–46.0)
Hemoglobin: 15 g/dL (ref 12.0–15.0)
IMMATURE GRANULOCYTES: 1 %
LYMPHS ABS: 1.1 10*3/uL (ref 0.7–4.0)
Lymphocytes Relative: 15 %
MCH: 30.3 pg (ref 26.0–34.0)
MCHC: 32 g/dL (ref 30.0–36.0)
MCV: 94.7 fL (ref 78.0–100.0)
Monocytes Absolute: 0.7 10*3/uL (ref 0.1–1.0)
Monocytes Relative: 10 %
NEUTROS PCT: 72 %
Neutro Abs: 5 10*3/uL (ref 1.7–7.7)
PLATELETS: 220 10*3/uL (ref 150–400)
RBC: 4.95 MIL/uL (ref 3.87–5.11)
RDW: 12.2 % (ref 11.5–15.5)
WBC: 6.9 10*3/uL (ref 4.0–10.5)

## 2018-04-09 LAB — COMPREHENSIVE METABOLIC PANEL
ALT: 14 U/L (ref 0–44)
AST: 17 U/L (ref 15–41)
Albumin: 3.9 g/dL (ref 3.5–5.0)
Alkaline Phosphatase: 78 U/L (ref 38–126)
Anion gap: 10 (ref 5–15)
BILIRUBIN TOTAL: 0.8 mg/dL (ref 0.3–1.2)
BUN: 5 mg/dL — ABNORMAL LOW (ref 8–23)
CHLORIDE: 101 mmol/L (ref 98–111)
CO2: 28 mmol/L (ref 22–32)
Calcium: 9.1 mg/dL (ref 8.9–10.3)
Creatinine, Ser: 0.74 mg/dL (ref 0.44–1.00)
Glucose, Bld: 104 mg/dL — ABNORMAL HIGH (ref 70–99)
POTASSIUM: 4.1 mmol/L (ref 3.5–5.1)
Sodium: 139 mmol/L (ref 135–145)
TOTAL PROTEIN: 6.2 g/dL — AB (ref 6.5–8.1)

## 2018-04-09 LAB — PROTIME-INR
INR: 0.96
PROTHROMBIN TIME: 12.7 s (ref 11.4–15.2)

## 2018-04-09 LAB — URINALYSIS, ROUTINE W REFLEX MICROSCOPIC
Bilirubin Urine: NEGATIVE
GLUCOSE, UA: NEGATIVE mg/dL
KETONES UR: NEGATIVE mg/dL
LEUKOCYTES UA: NEGATIVE
Nitrite: NEGATIVE
PH: 6 (ref 5.0–8.0)
Protein, ur: NEGATIVE mg/dL
SPECIFIC GRAVITY, URINE: 1.004 — AB (ref 1.005–1.030)

## 2018-04-09 LAB — APTT: aPTT: 32 seconds (ref 24–36)

## 2018-04-09 MED ORDER — CIPROFLOXACIN HCL 500 MG PO TABS: 500.0000 mg | ORAL_TABLET | Freq: Two times a day (BID) | ORAL | 0 refills | Status: DC

## 2018-04-09 NOTE — Progress Notes (Signed)
Abnormal UA results called in to Dr. Ophelia CharterYates' office by the lab personnel. Prescription called in and pt made aware, per note located in lab results column.

## 2018-04-13 ENCOUNTER — Encounter (HOSPITAL_COMMUNITY): Payer: Self-pay | Admitting: *Deleted

## 2018-04-13 ENCOUNTER — Encounter (HOSPITAL_COMMUNITY)
Admission: RE | Disposition: A | Discharge status: Home / Self Care | Payer: Self-pay | Source: Ambulatory Visit | Attending: Orthopaedic Surgery

## 2018-04-13 ENCOUNTER — Ambulatory Visit (HOSPITAL_COMMUNITY): Payer: Medicare Other

## 2018-04-13 ENCOUNTER — Ambulatory Visit (HOSPITAL_COMMUNITY): Payer: Medicare Other | Admitting: Certified Registered Nurse Anesthetist

## 2018-04-13 ENCOUNTER — Ambulatory Visit (HOSPITAL_COMMUNITY): Payer: Medicare Other | Admitting: Emergency Medicine

## 2018-04-13 ENCOUNTER — Observation Stay (HOSPITAL_COMMUNITY)
Admission: RE | Admit: 2018-04-13 | Discharge: 2018-04-14 | Disposition: A | Discharge status: Home / Self Care | Payer: Medicare Other | Source: Ambulatory Visit | Attending: Orthopaedic Surgery | Admitting: Orthopaedic Surgery

## 2018-04-13 DIAGNOSIS — Z419 Encounter for procedure for purposes other than remedying health state, unspecified: Secondary | ICD-10-CM

## 2018-04-13 DIAGNOSIS — F329 Major depressive disorder, single episode, unspecified: Secondary | ICD-10-CM | POA: Diagnosis not present

## 2018-04-13 DIAGNOSIS — M5126 Other intervertebral disc displacement, lumbar region: Secondary | ICD-10-CM | POA: Diagnosis not present

## 2018-04-13 DIAGNOSIS — Z79899 Other long term (current) drug therapy: Secondary | ICD-10-CM | POA: Diagnosis not present

## 2018-04-13 DIAGNOSIS — M48061 Spinal stenosis, lumbar region without neurogenic claudication: Secondary | ICD-10-CM | POA: Diagnosis not present

## 2018-04-13 DIAGNOSIS — K219 Gastro-esophageal reflux disease without esophagitis: Secondary | ICD-10-CM | POA: Diagnosis not present

## 2018-04-13 DIAGNOSIS — Z9889 Other specified postprocedural states: Secondary | ICD-10-CM

## 2018-04-13 DIAGNOSIS — F419 Anxiety disorder, unspecified: Secondary | ICD-10-CM | POA: Insufficient documentation

## 2018-04-13 DIAGNOSIS — F17210 Nicotine dependence, cigarettes, uncomplicated: Secondary | ICD-10-CM | POA: Insufficient documentation

## 2018-04-13 DIAGNOSIS — G4730 Sleep apnea, unspecified: Secondary | ICD-10-CM | POA: Diagnosis not present

## 2018-04-13 DIAGNOSIS — M48062 Spinal stenosis, lumbar region with neurogenic claudication: Secondary | ICD-10-CM

## 2018-04-13 DIAGNOSIS — F1020 Alcohol dependence, uncomplicated: Secondary | ICD-10-CM | POA: Insufficient documentation

## 2018-04-13 DIAGNOSIS — J449 Chronic obstructive pulmonary disease, unspecified: Secondary | ICD-10-CM | POA: Insufficient documentation

## 2018-04-13 HISTORY — PX: LUMBAR LAMINECTOMY/DECOMPRESSION MICRODISCECTOMY: SHX5026

## 2018-04-13 SURGERY — LUMBAR LAMINECTOMY/DECOMPRESSION MICRODISCECTOMY
Anesthesia: General | Site: Back

## 2018-04-13 MED ORDER — CHLORHEXIDINE GLUCONATE 4 % EX LIQD: 60.0000 mL | Freq: Once | CUTANEOUS | Status: DC

## 2018-04-13 MED ORDER — HYDROMORPHONE HCL 1 MG/ML IJ SOLN
INTRAMUSCULAR | Status: AC
  Filled 2018-04-13: qty 1

## 2018-04-13 MED ORDER — SODIUM CHLORIDE 0.9% FLUSH: 3.0000 mL | Freq: Two times a day (BID) | INTRAVENOUS | Status: DC

## 2018-04-13 MED ORDER — SUGAMMADEX SODIUM 200 MG/2ML IV SOLN
INTRAVENOUS | Status: DC | PRN
  Administered 2018-04-13: 200 mg via INTRAVENOUS

## 2018-04-13 MED ORDER — LIDOCAINE HCL (CARDIAC) PF 100 MG/5ML IV SOSY
PREFILLED_SYRINGE | INTRAVENOUS | Status: DC | PRN
  Administered 2018-04-13: 100 mg via INTRAVENOUS

## 2018-04-13 MED ORDER — PROMETHAZINE HCL 25 MG/ML IJ SOLN: 6.2500 mg | INTRAMUSCULAR | Status: DC | PRN

## 2018-04-13 MED ORDER — ONDANSETRON HCL 4 MG PO TABS: 4.0000 mg | ORAL_TABLET | Freq: Four times a day (QID) | ORAL | Status: DC | PRN

## 2018-04-13 MED ORDER — MOMETASONE FURO-FORMOTEROL FUM 200-5 MCG/ACT IN AERO
2.0000 | INHALATION_SPRAY | Freq: Two times a day (BID) | RESPIRATORY_TRACT | Status: DC
  Administered 2018-04-13 – 2018-04-14 (×2): 2 via RESPIRATORY_TRACT
  Filled 2018-04-13: qty 8.8

## 2018-04-13 MED ORDER — SODIUM CHLORIDE 0.9% FLUSH: 3.0000 mL | INTRAVENOUS | Status: DC | PRN

## 2018-04-13 MED ORDER — POTASSIUM 99 MG PO TABS: 99.0000 mg | ORAL_TABLET | Freq: Every day | ORAL | Status: DC

## 2018-04-13 MED ORDER — 0.9 % SODIUM CHLORIDE (POUR BTL) OPTIME
TOPICAL | Status: DC | PRN
  Administered 2018-04-13: 1000 mL

## 2018-04-13 MED ORDER — VENLAFAXINE HCL ER 75 MG PO CP24
75.0000 mg | ORAL_CAPSULE | Freq: Three times a day (TID) | ORAL | Status: DC
  Administered 2018-04-13 – 2018-04-14 (×2): 75 mg via ORAL
  Filled 2018-04-13 (×2): qty 1

## 2018-04-13 MED ORDER — VITAMIN B-6 100 MG PO TABS
100.0000 mg | ORAL_TABLET | Freq: Every day | ORAL | Status: DC
  Administered 2018-04-14: 100 mg via ORAL
  Filled 2018-04-13: qty 1

## 2018-04-13 MED ORDER — ROCURONIUM BROMIDE 100 MG/10ML IV SOLN
INTRAVENOUS | Status: DC | PRN
  Administered 2018-04-13: 50 mg via INTRAVENOUS

## 2018-04-13 MED ORDER — ONDANSETRON HCL 4 MG/2ML IJ SOLN: 4.0000 mg | Freq: Four times a day (QID) | INTRAMUSCULAR | Status: DC | PRN

## 2018-04-13 MED ORDER — DOCUSATE SODIUM 100 MG PO CAPS
100.0000 mg | ORAL_CAPSULE | Freq: Two times a day (BID) | ORAL | Status: DC
  Administered 2018-04-13: 100 mg via ORAL
  Filled 2018-04-13: qty 1

## 2018-04-13 MED ORDER — ALBUTEROL SULFATE HFA 108 (90 BASE) MCG/ACT IN AERS
INHALATION_SPRAY | RESPIRATORY_TRACT | Status: DC | PRN
  Administered 2018-04-13 (×4): 2 via RESPIRATORY_TRACT

## 2018-04-13 MED ORDER — MIDAZOLAM HCL 2 MG/2ML IJ SOLN
INTRAMUSCULAR | Status: AC
  Filled 2018-04-13: qty 2

## 2018-04-13 MED ORDER — MIDAZOLAM HCL 2 MG/2ML IJ SOLN
INTRAMUSCULAR | Status: DC | PRN
  Administered 2018-04-13: 1 mg via INTRAVENOUS

## 2018-04-13 MED ORDER — ONDANSETRON HCL 4 MG/2ML IJ SOLN
INTRAMUSCULAR | Status: DC | PRN
  Administered 2018-04-13: 4 mg via INTRAVENOUS

## 2018-04-13 MED ORDER — PHENOL 1.4 % MT LIQD: 1.0000 | OROMUCOSAL | Status: DC | PRN

## 2018-04-13 MED ORDER — LACTATED RINGERS IV SOLN
INTRAVENOUS | Status: DC
  Administered 2018-04-13: 13:00:00 via INTRAVENOUS

## 2018-04-13 MED ORDER — VANCOMYCIN HCL IN DEXTROSE 1-5 GM/200ML-% IV SOLN
1000.0000 mg | Freq: Once | INTRAVENOUS | Status: AC
  Administered 2018-04-14: 1000 mg via INTRAVENOUS
  Filled 2018-04-13: qty 200

## 2018-04-13 MED ORDER — HYDROMORPHONE HCL 1 MG/ML IJ SOLN
0.2500 mg | INTRAMUSCULAR | Status: DC | PRN
  Administered 2018-04-13 (×4): 0.5 mg via INTRAVENOUS

## 2018-04-13 MED ORDER — DEXAMETHASONE SODIUM PHOSPHATE 10 MG/ML IJ SOLN
INTRAMUSCULAR | Status: DC | PRN
  Administered 2018-04-13: 10 mg via INTRAVENOUS

## 2018-04-13 MED ORDER — FAMOTIDINE 20 MG PO TABS
10.0000 mg | ORAL_TABLET | Freq: Every day | ORAL | Status: DC
  Administered 2018-04-14: 10 mg via ORAL
  Filled 2018-04-13: qty 1

## 2018-04-13 MED ORDER — ROCURONIUM BROMIDE 10 MG/ML (PF) SYRINGE
PREFILLED_SYRINGE | INTRAVENOUS | Status: AC
  Filled 2018-04-13: qty 10

## 2018-04-13 MED ORDER — CIPROFLOXACIN HCL 500 MG PO TABS
500.0000 mg | ORAL_TABLET | Freq: Two times a day (BID) | ORAL | Status: DC
  Administered 2018-04-13 – 2018-04-14 (×2): 500 mg via ORAL
  Filled 2018-04-13 (×3): qty 1

## 2018-04-13 MED ORDER — OXYCODONE HCL 5 MG PO TABS
5.0000 mg | ORAL_TABLET | Freq: Four times a day (QID) | ORAL | Status: DC | PRN
  Administered 2018-04-13: 5 mg via ORAL

## 2018-04-13 MED ORDER — BUPIVACAINE HCL (PF) 0.25 % IJ SOLN
INTRAMUSCULAR | Status: DC | PRN
  Administered 2018-04-13: 10 mL

## 2018-04-13 MED ORDER — THROMBIN 5000 UNITS EX SOLR
CUTANEOUS | Status: AC
  Filled 2018-04-13: qty 5000

## 2018-04-13 MED ORDER — PROPOFOL 10 MG/ML IV BOLUS
INTRAVENOUS | Status: DC | PRN
  Administered 2018-04-13: 160 mg via INTRAVENOUS

## 2018-04-13 MED ORDER — ONDANSETRON HCL 4 MG/2ML IJ SOLN
INTRAMUSCULAR | Status: AC
  Filled 2018-04-13: qty 2

## 2018-04-13 MED ORDER — DIPHENHYDRAMINE HCL 25 MG PO CAPS
25.0000 mg | ORAL_CAPSULE | Freq: Four times a day (QID) | ORAL | Status: DC | PRN
  Administered 2018-04-13: 25 mg via ORAL
  Filled 2018-04-13: qty 1

## 2018-04-13 MED ORDER — OXYCODONE HCL 5 MG PO TABS
ORAL_TABLET | ORAL | Status: AC
  Filled 2018-04-13: qty 1

## 2018-04-13 MED ORDER — BUPIVACAINE HCL (PF) 0.25 % IJ SOLN
INTRAMUSCULAR | Status: AC
  Filled 2018-04-13: qty 30

## 2018-04-13 MED ORDER — MENTHOL 3 MG MT LOZG: 1.0000 | LOZENGE | OROMUCOSAL | Status: DC | PRN

## 2018-04-13 MED ORDER — DEXAMETHASONE SODIUM PHOSPHATE 10 MG/ML IJ SOLN
INTRAMUSCULAR | Status: AC
  Filled 2018-04-13: qty 1

## 2018-04-13 MED ORDER — ALBUTEROL SULFATE (2.5 MG/3ML) 0.083% IN NEBU: 2.5000 mg | INHALATION_SOLUTION | RESPIRATORY_TRACT | Status: DC | PRN

## 2018-04-13 MED ORDER — METHOCARBAMOL 500 MG PO TABS
500.0000 mg | ORAL_TABLET | Freq: Four times a day (QID) | ORAL | Status: DC | PRN
  Administered 2018-04-13 – 2018-04-14 (×3): 500 mg via ORAL
  Filled 2018-04-13 (×3): qty 1

## 2018-04-13 MED ORDER — SUGAMMADEX SODIUM 200 MG/2ML IV SOLN
INTRAVENOUS | Status: AC
  Filled 2018-04-13: qty 2

## 2018-04-13 MED ORDER — LIDOCAINE 2% (20 MG/ML) 5 ML SYRINGE
INTRAMUSCULAR | Status: AC
  Filled 2018-04-13: qty 5

## 2018-04-13 MED ORDER — FENTANYL CITRATE (PF) 250 MCG/5ML IJ SOLN
INTRAMUSCULAR | Status: DC | PRN
  Administered 2018-04-13 (×2): 50 ug via INTRAVENOUS
  Administered 2018-04-13: 100 ug via INTRAVENOUS
  Administered 2018-04-13: 50 ug via INTRAVENOUS

## 2018-04-13 MED ORDER — VANCOMYCIN HCL IN DEXTROSE 1-5 GM/200ML-% IV SOLN
1000.0000 mg | INTRAVENOUS | Status: AC
  Administered 2018-04-13: 1000 mg via INTRAVENOUS
  Filled 2018-04-13: qty 200

## 2018-04-13 MED ORDER — TRAMADOL HCL 50 MG PO TABS
100.0000 mg | ORAL_TABLET | Freq: Four times a day (QID) | ORAL | Status: DC | PRN
  Administered 2018-04-13 – 2018-04-14 (×2): 100 mg via ORAL
  Filled 2018-04-13 (×2): qty 2

## 2018-04-13 MED ORDER — SODIUM CHLORIDE 0.9 % IV SOLN: INTRAVENOUS | Status: DC

## 2018-04-13 MED ORDER — FENTANYL CITRATE (PF) 250 MCG/5ML IJ SOLN
INTRAMUSCULAR | Status: AC
  Filled 2018-04-13: qty 5

## 2018-04-13 MED ORDER — VITAMIN B-1 50 MG PO TABS
250.0000 mg | ORAL_TABLET | Freq: Every day | ORAL | Status: DC
  Administered 2018-04-14: 250 mg via ORAL
  Filled 2018-04-13: qty 1

## 2018-04-13 SURGICAL SUPPLY — 43 items
BUR ROUND FLUTED 4 SOFT TCH (BURR) IMPLANT
BUR ROUND FLUTED 4MM SOFT TCH (BURR)
CANISTER SUCT 3000ML PPV (MISCELLANEOUS) ×3 IMPLANT
CLOSURE STERI-STRIP 1/2X4 (GAUZE/BANDAGES/DRESSINGS) ×1
CLSR STERI-STRIP ANTIMIC 1/2X4 (GAUZE/BANDAGES/DRESSINGS) ×2 IMPLANT
COVER SURGICAL LIGHT HANDLE (MISCELLANEOUS) ×3 IMPLANT
DECANTER SPIKE VIAL GLASS SM (MISCELLANEOUS) ×3 IMPLANT
DRAPE HALF SHEET 40X57 (DRAPES) ×6 IMPLANT
DRAPE MICROSCOPE LEICA (MISCELLANEOUS) ×3 IMPLANT
DRAPE SURG 17X23 STRL (DRAPES) ×3 IMPLANT
DRSG MEPILEX BORDER 4X4 (GAUZE/BANDAGES/DRESSINGS) ×3 IMPLANT
DURAPREP 26ML APPLICATOR (WOUND CARE) ×3 IMPLANT
ELECT REM PT RETURN 9FT ADLT (ELECTROSURGICAL) ×3
ELECTRODE REM PT RTRN 9FT ADLT (ELECTROSURGICAL) ×1 IMPLANT
GLOVE BIOGEL PI IND STRL 8 (GLOVE) ×2 IMPLANT
GLOVE BIOGEL PI INDICATOR 8 (GLOVE) ×4
GLOVE ORTHO TXT STRL SZ7.5 (GLOVE) ×6 IMPLANT
GOWN STRL REUS W/ TWL LRG LVL3 (GOWN DISPOSABLE) ×2 IMPLANT
GOWN STRL REUS W/ TWL XL LVL3 (GOWN DISPOSABLE) ×1 IMPLANT
GOWN STRL REUS W/TWL 2XL LVL3 (GOWN DISPOSABLE) ×3 IMPLANT
GOWN STRL REUS W/TWL LRG LVL3 (GOWN DISPOSABLE) ×6
GOWN STRL REUS W/TWL XL LVL3 (GOWN DISPOSABLE) ×3
KIT BASIN OR (CUSTOM PROCEDURE TRAY) ×3 IMPLANT
KIT TURNOVER KIT B (KITS) ×3 IMPLANT
MANIFOLD NEPTUNE II (INSTRUMENTS) ×3 IMPLANT
NDL HYPO 25GX1X1/2 BEV (NEEDLE) ×1 IMPLANT
NDL SPNL 18GX3.5 QUINCKE PK (NEEDLE) ×1 IMPLANT
NEEDLE HYPO 25GX1X1/2 BEV (NEEDLE) ×3 IMPLANT
NEEDLE SPNL 18GX3.5 QUINCKE PK (NEEDLE) ×3 IMPLANT
NS IRRIG 1000ML POUR BTL (IV SOLUTION) ×3 IMPLANT
PACK LAMINECTOMY ORTHO (CUSTOM PROCEDURE TRAY) ×3 IMPLANT
PAD ARMBOARD 7.5X6 YLW CONV (MISCELLANEOUS) ×6 IMPLANT
PATTIES SURGICAL .5 X.5 (GAUZE/BANDAGES/DRESSINGS) IMPLANT
PATTIES SURGICAL .75X.75 (GAUZE/BANDAGES/DRESSINGS) IMPLANT
SUT VIC AB 0 CT1 27 (SUTURE)
SUT VIC AB 0 CT1 27XBRD ANBCTR (SUTURE) IMPLANT
SUT VIC AB 1 CTX 36 (SUTURE) ×3
SUT VIC AB 1 CTX36XBRD ANBCTR (SUTURE) ×1 IMPLANT
SUT VIC AB 2-0 CT1 27 (SUTURE) ×3
SUT VIC AB 2-0 CT1 TAPERPNT 27 (SUTURE) ×1 IMPLANT
SUT VIC AB 3-0 X1 27 (SUTURE) ×3 IMPLANT
TOWEL OR 17X24 6PK STRL BLUE (TOWEL DISPOSABLE) ×3 IMPLANT
TOWEL OR 17X26 10 PK STRL BLUE (TOWEL DISPOSABLE) ×3 IMPLANT

## 2018-04-13 NOTE — Anesthesia Procedure Notes (Signed)
Procedure Name: Intubation Date/Time: 04/13/2018 3:29 PM Performed by: Raenette Rover, CRNA Pre-anesthesia Checklist: Patient identified, Emergency Drugs available, Suction available and Patient being monitored Patient Re-evaluated:Patient Re-evaluated prior to induction Oxygen Delivery Method: Circle system utilized Preoxygenation: Pre-oxygenation with 100% oxygen Induction Type: IV induction Ventilation: Mask ventilation without difficulty Laryngoscope Size: Mac and 3 Grade View: Grade I Tube type: Oral Tube size: 7.0 mm Number of attempts: 1 Airway Equipment and Method: Stylet Placement Confirmation: ETT inserted through vocal cords under direct vision,  positive ETCO2,  CO2 detector and breath sounds checked- equal and bilateral Secured at: 21 cm Tube secured with: Tape Dental Injury: Teeth and Oropharynx as per pre-operative assessment

## 2018-04-13 NOTE — Transfer of Care (Signed)
Immediate Anesthesia Transfer of Care Note  Patient: Whitney Kelley  Procedure(s) Performed: L3-4 CENTRAL DECOMPRESSION, MICRODISCECTOMY (N/A Back)  Patient Location: PACU  Anesthesia Type:General  Level of Consciousness: awake and patient cooperative  Airway & Oxygen Therapy: Patient Spontanous Breathing  Post-op Assessment: Report given to RN and Post -op Vital signs reviewed and stable  Post vital signs: Reviewed and stable  Last Vitals:  Vitals Value Taken Time  BP 161/76 04/13/2018  5:19 PM  Temp    Pulse 98 04/13/2018  5:22 PM  Resp 14 04/13/2018  5:22 PM  SpO2 92 % 04/13/2018  5:22 PM  Vitals shown include unvalidated device data.  Last Pain:  Vitals:   04/13/18 1721  TempSrc:   PainSc: 10-Worst pain ever      Patients Stated Pain Goal: 4 (04/13/18 1721)  Complications: No apparent anesthesia complications

## 2018-04-13 NOTE — Interval H&P Note (Signed)
History and Physical Interval Note:  04/13/2018 2:27 PM  Whitney Kelley  has presented today for surgery, with the diagnosis of L3-4 STENOSIS  The various methods of treatment have been discussed with the patient and family. After consideration of risks, benefits and other options for treatment, the patient has consented to  Procedure(s): L3-4 CENTRAL DECOMPRESSION, MICRODISCECTOMY (N/A) as a surgical intervention .  The patient's history has been reviewed, patient examined, no change in status, stable for surgery.  I have reviewed the patient's chart and labs.  Questions were answered to the patient's satisfaction.     Eldred MangesMark C Miesha Bachmann

## 2018-04-13 NOTE — Anesthesia Preprocedure Evaluation (Addendum)
Anesthesia Evaluation  Patient identified by MRN, date of birth, ID band Patient awake    Reviewed: Allergy & Precautions, NPO status , Patient's Chart, lab work & pertinent test results  History of Anesthesia Complications Negative for: history of anesthetic complications  Airway Mallampati: II  TM Distance: >3 FB Neck ROM: Full    Dental  (+) Caps, Dental Advisory Given   Pulmonary COPD,  COPD inhaler, Current Smoker,    Pulmonary exam normal        Cardiovascular negative cardio ROS Normal cardiovascular exam     Neuro/Psych Anxiety Depression    GI/Hepatic GERD  Controlled,(+)     substance abuse  alcohol use,   Endo/Other  negative endocrine ROS  Renal/GU negative Renal ROS     Musculoskeletal   Abdominal   Peds  Hematology negative hematology ROS (+)   Anesthesia Other Findings   Reproductive/Obstetrics                             Anesthesia Physical Anesthesia Plan  ASA: III  Anesthesia Plan: General   Post-op Pain Management:    Induction: Intravenous  PONV Risk Score and Plan: 2 and Ondansetron and Dexamethasone  Airway Management Planned: Oral ETT  Additional Equipment:   Intra-op Plan:   Post-operative Plan: Extubation in OR  Informed Consent: I have reviewed the patients History and Physical, chart, labs and discussed the procedure including the risks, benefits and alternatives for the proposed anesthesia with the patient or authorized representative who has indicated his/her understanding and acceptance.   Dental advisory given  Plan Discussed with: CRNA, Anesthesiologist and Surgeon  Anesthesia Plan Comments:        Anesthesia Quick Evaluation

## 2018-04-13 NOTE — Progress Notes (Signed)
Vancomycin per Pharmacy for surgical prophylaxis  77 YO F s/p spinal surgery, on vancomycin for surgical prophylaxis d/t amoxicillin allergy (anaphylaxis), pharmacy to dose vancomycin for post-op prophylaxis. Weight = 70 kg, Scr 0.74 on 04/09/2018, est. crcl ~ 70 ml/min. Received pre-op dose 1g vancomycin at ~ 1330. No drain per chart  Plan: Vancomycin 1000mg  x at 0200 tomorrow AM. Pharmacy sign off.  Thanks.   Bayard HuggerMei Beauty Pless, PharmD, BCPS, BCPPS Clinical Pharmacist  Pager: [phone]

## 2018-04-13 NOTE — Op Note (Signed)
Preop diagnosis: L3-4 spinal stenosis, multifactorial with L3-4 large disc bulge  Postop diagnosis: Same  Procedure: L3-4 decompression, bilateral microdiscectomy.  Surgeon: Annell GreeningMark Ashely Joshua, MD  Assistant: Zonia KiefJames Owens, PA-C medically necessary and present for the entire procedure  Anesthesia General plus Marcaine local  Findings: Multifactorial spinal stenosis large broad-based disc protrusion at L3-4.  Procedure after induction general anesthesia antibody prophylaxis patient was placed prone on chest rolls timeout procedure after DuraPrep.  Arms were at 9090 careful padding and positioning.  Calf pumpers were applied.  Area squared with towels old scar was outlined which was at L4-5 and then extended proximally to the L3-4 level.  Crosshatch is Betadine Steri-Drape laminectomy sheets drapes.  Incision was made sub-proximal section on the lamina after needle localization with spinal needle above and below the L3-4 disc space.  After sub-proximal dissection self retaining retractor Coker clamps were placed above and below a repeat x-ray taken and then a third final x-ray was taken after the Kocher clamps were adjusted for planned areas of decompression.  Self-retaining McCullough retractor was placed.  Spinous process was removed with the Kerrison rondure.  Lamina was thinned.  4 mm bur was used in the midline at the base of the spinous process tenting the limb lamina and then a 3 and 4 mm Kerrison using operative microscope was used to remove the remaining bone thick chunks of ligament.  There is overhanging spurs from the facet joint slightly more prominent on the right side than the left.  There is diffuse disc bulge which is present on both sides.  Epidural veins were coagulated with bipolar cautery dura was carefully protected and dura was continued to be decompressed until was a round tube without areas of compression epidural fat was present at the cephalad portion of the incision.  Annulus was  incised first on the left side decompression with some subligamentous disc material which had extruded and migrated cephalad behind the endplate at L3.  Pieces were teased out with straight micropituitary and up micropituitary's removing disc fragments.  Previous straight pituitary was used.  Identical procedure was performed switching spots with Zonia KiefJames Owens, PA-C who is assisting.  With protection of the dura with the dura [place] on the right side there is little bit more disc material present which was decompressed until the disc was flat.  Good relief of spinal stenosis dura was intact some thrombin Gelfoam was placed in the gutters waited a few minutes of operative field was dry.  Standard layer closure #1 Vicryl 2-0 Vicryl subtenons tissue skin staple closure Marcaine infiltration postop dressing and transferred to recovery room.

## 2018-04-13 NOTE — Anesthesia Postprocedure Evaluation (Signed)
Anesthesia Post Note  Patient: Whitney Kelley  Procedure(s) Performed: L3-4 CENTRAL DECOMPRESSION, MICRODISCECTOMY (N/A Back)     Patient location during evaluation: PACU Anesthesia Type: General Level of consciousness: sedated Pain management: pain level controlled Vital Signs Assessment: post-procedure vital signs reviewed and stable Respiratory status: spontaneous breathing and respiratory function stable Cardiovascular status: stable Postop Assessment: no apparent nausea or vomiting Anesthetic complications: no    Last Vitals:  Vitals:   04/13/18 1733 04/13/18 1749  BP: (!) 158/91 (!) 152/96  Pulse: 95 97  Resp: 19 19  Temp:    SpO2: 92% (!) 89%    Last Pain:  Vitals:   04/13/18 1733  TempSrc:   PainSc: 7                  Bao Coreas DANIEL

## 2018-04-14 ENCOUNTER — Encounter (HOSPITAL_COMMUNITY): Payer: Self-pay | Admitting: Orthopaedic Surgery

## 2018-04-14 DIAGNOSIS — M48061 Spinal stenosis, lumbar region without neurogenic claudication: Secondary | ICD-10-CM | POA: Diagnosis not present

## 2018-04-14 MED ORDER — OXYCODONE-ACETAMINOPHEN 5-325 MG PO TABS
1.0000 | ORAL_TABLET | ORAL | Status: DC | PRN
  Administered 2018-04-14: 2 via ORAL
  Filled 2018-04-14: qty 2

## 2018-04-14 MED ORDER — OXYCODONE-ACETAMINOPHEN 5-325 MG PO TABS: 1.0000 | ORAL_TABLET | Freq: Four times a day (QID) | ORAL | 0 refills | Status: DC | PRN

## 2018-04-14 MED ORDER — THROMBIN 5000 UNITS EX SOLR
CUTANEOUS | Status: DC | PRN
  Administered 2018-04-14: 5000 [IU] via TOPICAL

## 2018-04-14 MED ORDER — HYDROXYZINE HCL 25 MG PO TABS
25.0000 mg | ORAL_TABLET | Freq: Three times a day (TID) | ORAL | Status: DC | PRN
  Filled 2018-04-14: qty 2

## 2018-04-14 NOTE — Evaluation (Signed)
Physical Therapy Evaluation Patient Details Name: Whitney Kelley F Daniele MRN: [id number] DOB: 06/21/1941 Today's Date: 04/14/2018   History of Present Illness  Pt is a 77 y.o. female s/p L3-4 Central decompression, Microdiscectomy. PMHx: Anxiety, Alcoholism, Arthritis, Ataxia, COPD, Depression, Sleep apnea, Cervical spine fusion x2.  Clinical Impression  Patient is s/p above surgery resulting in the deficits listed below (see PT Problem List). Pt impulsive with decreased safety awareness and generalized weakness. Pt non-compliant with back precautions functionally and is fixated on going home and having her cigarette. Pt with 2 near fall episodes during coughing while ambulating due to onset of severe pain in back.  Patient will benefit from skilled PT to increase their independence and safety with mobility (while adhering to their precautions) to allow discharge to the venue listed below.   Follow Up Recommendations Home health PT;Supervision/Assistance - 24 hour    Equipment Recommendations  Rolling walker with 5" wheels    Recommendations for Other Services       Precautions / Restrictions Precautions Precautions: Fall;Back Precaution Booklet Issued: Yes (comment) Precaution Comments: Educated pt on back precautions, bed mobility, community ambulation c AD and ascending/descending stairs, verbal cues to adhere functionally Restrictions Weight Bearing Restrictions: No      Mobility  Bed Mobility Overal bed mobility: Needs Assistance Bed Mobility: Rolling;Sidelying to Sit;Sit to Sidelying Rolling: Supervision Sidelying to sit: Supervision     Sit to sidelying: Supervision General bed mobility comments: pt reports "I'll never lay on my back", pt able to push self up and maintain back precautions to transfer to EOB  Transfers Overall transfer level: Needs assistance Equipment used: None Transfers: Sit to/from Stand Sit to Stand: Min guard;Supervision         General transfer  comment: Sit<>stand with CGA and verbal cues for back precations  Ambulation/Gait Ambulation/Gait assistance: Supervision;Min guard   Assistive device: Rolling walker (2 wheeled);None Gait Pattern/deviations: Narrow base of support;Decreased stride length;Decreased step length - left Gait velocity: Slow gait velocity Gait velocity interpretation: <1.8 ft/sec, indicate of risk for recurrent falls General Gait Details: Slow gait speed with narrowed base of support and decreased arm swing when ambulating without AD. Improved gait pattern and balance with RW.. pt had to cough during ambulation and pt dropped down towards the floor. PT prevented fall with minA. Pt states "every time I cough my legs just give out"  Stairs Stairs: Yes Stairs assistance: Min assist Stair Management: Step to pattern;Two rails Number of Stairs: 10 General stair comments: pt educated on step to pattern as pt with increased L LE pain however pt continues to utilize reciprocal gait pattern  Wheelchair Mobility    Modified Rankin (Stroke Patients Only)       Balance Overall balance assessment: Mild deficits observed, not formally tested;History of Falls                                           Pertinent Vitals/Pain Pain Assessment: 0-10 Pain Score: 8  Faces Pain Scale: Hurts little more Pain Location: back Pain Descriptors / Indicators: Aching Pain Intervention(s): Monitored during session    Home Living Family/patient expects to be discharged to:: Private residence Living Arrangements: Alone Available Help at Discharge: Family;Neighbor;Home health Type of Home: Mobile home Home Access: Stairs to enter Entrance Stairs-Rails: Can reach both Entrance Stairs-Number of Steps: 5 Home Layout: One level Home Equipment: Walker - 2 wheels(she  can get it from her sister)      Prior Function Level of Independence: Independent         Comments: has had h/o falls, reports driving and  doing grocery shopping but occasionally has neighbor go and get them     Hand Dominance   Dominant Hand: Right    Extremity/Trunk Assessment   Upper Extremity Assessment Upper Extremity Assessment: Overall WFL for tasks assessed    Lower Extremity Assessment Lower Extremity Assessment: Generalized weakness    Cervical / Trunk Assessment Cervical / Trunk Assessment: Other exceptions Cervical / Trunk Exceptions: s/p spine sx  Communication   Communication: HOH  Cognition Arousal/Alertness: Awake/alert Behavior During Therapy: Impulsive Overall Cognitive Status: Within Functional Limits for tasks assessed                                 General Comments: Reitterated importance of back precatuions multiple times through out session as patient unable to verbally discuss them with PT at beginning of session      General Comments General comments (skin integrity, edema, etc.): incision with dressing intact    Exercises     Assessment/Plan    PT Assessment Patient needs continued PT services  PT Problem List Decreased activity tolerance;Decreased strength;Decreased balance;Decreased mobility;Pain;Decreased safety awareness;Decreased knowledge of use of DME;Decreased cognition;Decreased knowledge of precautions       PT Treatment Interventions DME instruction;Gait training;Stair training;Functional mobility training;Therapeutic activities;Therapeutic exercise;Balance training;Neuromuscular re-education    PT Goals (Current goals can be found in the Care Plan section)  Acute Rehab PT Goals Patient Stated Goal: home today PT Goal Formulation: With patient Time For Goal Achievement: 04/28/18 Potential to Achieve Goals: Good    Frequency Min 5X/week   Barriers to discharge Decreased caregiver support reports her sister and her neighbor will be helping her out but "I don't need anyone. I'll be just fine"    Co-evaluation               AM-PAC PT "6  Clicks" Daily Activity  Outcome Measure Difficulty turning over in bed (including adjusting bedclothes, sheets and blankets)?: A Little Difficulty moving from lying on back to sitting on the side of the bed? : A Little Difficulty sitting down on and standing up from a chair with arms (e.g., wheelchair, bedside commode, etc,.)?: Unable Help needed moving to and from a bed to chair (including a wheelchair)?: A Little Help needed walking in hospital room?: A Little Help needed climbing 3-5 steps with a railing? : A Lot 6 Click Score: 15    End of Session Equipment Utilized During Treatment: Gait belt Activity Tolerance: Patient tolerated treatment well Patient left: in bed;with nursing/sitter in room;with call bell/phone within reach(sitting EOB) Nurse Communication: Mobility status PT Visit Diagnosis: Unsteadiness on feet (R26.81);Pain;Muscle weakness (generalized) (M62.81) Pain - part of body: (back)    Time: 1610-9604 PT Time Calculation (min) (ACUTE ONLY): 21 min   Charges:   PT Evaluation $PT Eval Moderate Complexity: 1 Mod     PT G Codes:        Lewis Shock, PT, DPT Pager #: [phone] Office #: [phone]   Donte Lenzo M Wells Gerdeman 04/14/2018, 10:52 AM

## 2018-04-14 NOTE — Progress Notes (Signed)
   Subjective: 1 Day Post-Op Procedure(s) (LRB): L3-4 CENTRAL DECOMPRESSION, MICRODISCECTOMY (N/A) Patient reports pain as moderate incisional pain. Ambulating in hall, leg pain gone.    Objective: Vital signs in last 24 hours: Temp:  [97.6 F (36.4 C)-100.4 F (38 C)] 100.4 F (38 C) (07/23 0726) Pulse Rate:  [83-106] 93 (07/23 0726) Resp:  [16-21] 16 (07/23 0726) BP: (135-161)/(76-99) 135/82 (07/23 0726) SpO2:  [89 %-98 %] 95 % (07/23 0726)  Intake/Output from previous day: 07/22 0701 - 07/23 0700 In: 1200 [P.O.:200; I.V.:1000] Out: 100 [Blood:100] Intake/Output this shift: No intake/output data recorded.  No results for input(s): HGB in the last 72 hours. No results for input(s): WBC, RBC, HCT, PLT in the last 72 hours. No results for input(s): NA, K, CL, CO2, BUN, CREATININE, GLUCOSE, CALCIUM in the last 72 hours. No results for input(s): LABPT, INR in the last 72 hours.  Neurologically intact Dg Lumbar Spine 2-3 Views  Result Date: 04/13/2018 CLINICAL DATA:  Localization film for L4-5 discectomy EXAM: LUMBAR SPINE - 2-3 VIEW COMPARISON:  lumbar MRI 02/18/2018 FINDINGS: Spinal numbering as on the previous MRI. 3 intraprocedural images were submitted. Spinal needles followed by clamps for localization of spinous processes. On the final image from 2 clamps overlap the L2 and L3 spinous processes. IMPRESSION: Intraoperative localization with instruments overlapping the L2 and L3 spinous processes on the final image. Electronically Signed   By: Marnee SpringJonathon  Watts M.D.   On: 04/13/2018 16:13    Assessment/Plan: 1 Day Post-Op Procedure(s) (LRB): L3-4 CENTRAL DECOMPRESSION, MICRODISCECTOMY (N/A) Plan:  Dressing change, discharge.   Eldred MangesMark C Noble Cicalese 04/14/2018, 7:28 AM

## 2018-04-14 NOTE — Care Management Note (Signed)
Case Management Note  Patient Details  Name: Whitney Kelley MRN: [id number] Date of Birth: 01/09/1941  Subjective/Objective:  77 yr old female s/p L3-4 central decompression, microdiscectomy.                  Action/Plan: Case manager spoke with patient concerning discharge plan. Choice for Home Health Agency offered, referral was called to Shon Milletan Phillips, RN, [facility] Liaison. Patient will have family support at discharge.     Expected Discharge Date:  04/14/18               Expected Discharge Plan:  Home w Home Health Services  In-House Referral:  NA  Discharge planning Services  CM Consult  Post Acute Care Choice:  Home Health Choice offered to:  Patient  DME Arranged:  Walker rolling DME Agency:  [facility]  [facility] Arr[facility]  [facility]  Status of Service:  Completed, signed off  If discussed at Long Length of Stay Meetings, dates discussed:    Additional Comments:  Durenda GuthrieBrady, Daine Gunther Naomi, RN 04/14/2018, 11:09 AM

## 2018-04-14 NOTE — Care Management Obs Status (Signed)
MEDICARE OBSERVATION STATUS NOTIFICATION   Patient Details  Name: Whitney Kelley MRN: [id number] Date of Birth: 07/02/1941   Medicare Observation Status Notification Given:       Durenda GuthrieBrady, Aliscia Clayton Naomi, RN 04/14/2018, 10:04 AM

## 2018-04-14 NOTE — Evaluation (Signed)
Occupational Therapy Evaluation Patient Details Name: Whitney Kelley MRN: [id number] DOB: 07/14/1941 Today's Date: 04/14/2018    History of Present Illness Pt is a 77 y.o. female s/p L3-4 Central decompression, Microdiscectomy. PMHx: Anxiety, Alcoholism, Arthritis, Ataxia, COPD, Depression, Sleep apnea, Cervical spine fusion x2.   Clinical Impression   Pt reports she was independent with ADL PTA. Currently pt overall supervision for ADL and functional mobility with max cues to maintain back precautions during all tasks; pt breaking back precautions with almost every activity during session. Pt planning to d/c home with supervision from her sister. Pt would benefit from continued skilled OT to address established goals.    Follow Up Recommendations  No OT follow up    Equipment Recommendations  None recommended by OT    Recommendations for Other Services       Precautions / Restrictions Precautions Precautions: Fall;Back Precaution Booklet Issued: Yes (comment) Precaution Comments: Educated pt on 3/3 back precautions, only able to recall 1/3 at end of session Restrictions Weight Bearing Restrictions: No      Mobility Bed Mobility Overal bed mobility: Needs Assistance Bed Mobility: Rolling;Sidelying to Sit Rolling: Supervision Sidelying to sit: Supervision       General bed mobility comments: Max cues throughout for sequencing and log roll technique. Pt wanting to come into long sit from supine  Transfers Overall transfer level: Needs assistance Equipment used: None Transfers: Sit to/from Stand Sit to Stand: Min guard;Supervision         General transfer comment: Min guard initially with transition to supervision by end of session. Cues for posture and technique.    Balance Overall balance assessment: Mild deficits observed, not formally tested                                         ADL either performed or assessed with clinical judgement    ADL Overall ADL's : Needs assistance/impaired Eating/Feeding: Set up;Sitting   Grooming: Supervision/safety;Standing;Wash/dry hands;Oral care Grooming Details (indicate cue type and reason): Cues to maintain back precautions. Educated on use of 2 cups for oral care Upper Body Bathing: Set up;Sitting   Lower Body Bathing: Supervison/ safety;Sit to/from stand   Upper Body Dressing : Set up;Sitting   Lower Body Dressing: Supervision/safety;Sit to/from stand Lower Body Dressing Details (indicate cue type and reason): Educated on compensatory strategies for LB ADL, pt able to perform with cues for "no bending" Toilet Transfer: Supervision/safety;Ambulation;Regular Toilet   Toileting- ArchitectClothing Manipulation and Hygiene: Supervision/safety;Sit to/from stand Toileting - Clothing Manipulation Details (indicate cue type and reason): Cues for "no bending, no twisting" during peri care. Educated on proper techique Tub/ Engineer, structuralhower Transfer: Supervision/safety;Tub transfer;Ambulation Tub/Shower Transfer Details (indicate cue type and reason): Simulated in room. Pt able to perform with supervision for safety Functional mobility during ADLs: Supervision/safety General ADL Comments: Educated pt on log roll for bed mobility, frequent mobility throughout the day upon return home, maintaining back precautions during functional activities.     Vision         Perception     Praxis      Pertinent Vitals/Pain Pain Assessment: Faces Faces Pain Scale: Hurts little more Pain Location: back Pain Descriptors / Indicators: Aching;Sore Pain Intervention(s): Monitored during session;Repositioned;Patient requesting pain meds-RN notified     Hand Dominance Right   Extremity/Trunk Assessment Upper Extremity Assessment Upper Extremity Assessment: Overall WFL for tasks assessed   Lower Extremity  Assessment Lower Extremity Assessment: Defer to PT evaluation   Cervical / Trunk Assessment Cervical / Trunk  Assessment: Other exceptions Cervical / Trunk Exceptions: s/p spine sx   Communication Communication Communication: HOH   Cognition Arousal/Alertness: Awake/alert Behavior During Therapy: Impulsive Overall Cognitive Status: Within Functional Limits for tasks assessed                                 General Comments: Does not adhere well to back precautions despite education and cues during functional tasks.    General Comments       Exercises     Shoulder Instructions      Home Living Family/patient expects to be discharged to:: Private residence Living Arrangements: Alone Available Help at Discharge: Family;Available 24 hours/day(sister planning to stay with her initially) Type of Home: Mobile home Home Access: Stairs to enter Entrance Stairs-Number of Steps: 5   Home Layout: One level     Bathroom Shower/Tub: Chief Strategy Officer: Standard     Home Equipment: None          Prior Functioning/Environment Level of Independence: Independent                 OT Problem List: Decreased strength;Decreased activity tolerance;Impaired balance (sitting and/or standing);Decreased safety awareness;Decreased knowledge of use of DME or AE;Decreased knowledge of precautions;Pain      OT Treatment/Interventions: Self-care/ADL training;Energy conservation;DME and/or AE instruction;Therapeutic activities;Patient/family education;Balance training    OT Goals(Current goals can be found in the care plan section) Acute Rehab OT Goals Patient Stated Goal: go home today OT Goal Formulation: With patient Time For Goal Achievement: 04/28/18 Potential to Achieve Goals: Good ADL Goals Additional ADL Goal #1: Pt will independently verbally recall 3/3 back precautions and maintain during functional activities. Additional ADL Goal #2: Pt will perform log roll for bed mobility with mod I. Additional ADL Goal #3: Pt will gather ADL items and perform ADL with  mod I.  OT Frequency: Min 2X/week   Barriers to D/C:            Co-evaluation              AM-PAC PT "6 Clicks" Daily Activity     Outcome Measure Help from another person eating meals?: None Help from another person taking care of personal grooming?: A Little Help from another person toileting, which includes using toliet, bedpan, or urinal?: A Little Help from another person bathing (including washing, rinsing, drying)?: A Little Help from another person to put on and taking off regular upper body clothing?: None Help from another person to put on and taking off regular lower body clothing?: A Little 6 Click Score: 20   End of Session Nurse Communication: Mobility status;Patient requests pain meds;Other (comment)(no equipment or f/u needs)  Activity Tolerance: Patient tolerated treatment well Patient left: with call bell/phone within reach;Other (comment)(sitting EOB)  OT Visit Diagnosis: Unsteadiness on feet (R26.81);Pain Pain - part of body: (back)                Time: 0981-1914 OT Time Calculation (min): 19 min Charges:  OT General Charges $OT Visit: 1 Visit OT Evaluation $OT Eval Moderate Complexity: 1 Mod G-Codes:     Whitney Kelley A. Brett Albino, M.S., OTR/L Acute Rehab Department: [phone]  Gaye Alken 04/14/2018, 9:20 AM

## 2018-04-14 NOTE — Discharge Instructions (Signed)
Walk daily, avoid bending and lifting. See Dr. Ophelia CharterYates in about one week. Ok to shower.

## 2018-04-14 NOTE — Care Management Obs Status (Signed)
MEDICARE OBSERVATION STATUS NOTIFICATION   Patient Details  Name: Whitney Kelley MRN: [id number] Date of Birth: 09/20/1941   Medicare Observation Status Notification Given:  Yes    Durenda GuthrieBrady, Ijanae Macapagal Naomi, RN 04/14/2018, 10:04 AM

## 2018-04-15 ENCOUNTER — Telehealth (INDEPENDENT_AMBULATORY_CARE_PROVIDER_SITE_OTHER): Payer: Self-pay | Admitting: Orthopaedic Surgery

## 2018-04-15 NOTE — Telephone Encounter (Signed)
Please advise 

## 2018-04-15 NOTE — Telephone Encounter (Signed)
T[facility] C[facility]ingsraig, PT, [facility] for the following:  Rolling Walker Beside Commode [facility] [phone].  PT: 2x a week for 6 weeks 1 a week for 3 weeks for strengthening, gait training, and balance.  She was having a lot more pain today during the visit and seemed to be weaker in her legs.  Wants to know if this is normal.  CB#[phone].  Thank you.

## 2018-04-16 NOTE — Telephone Encounter (Signed)
Ok thx.

## 2018-04-16 NOTE — Telephone Encounter (Signed)
I called Tasia CatchingsCraig and advised. He states patient was still a little weak in the legs. He will see her tomorrow to see if that has gotten any better. He will call if continued or worsening problems.   [facility]

## 2018-04-17 ENCOUNTER — Telehealth (INDEPENDENT_AMBULATORY_CARE_PROVIDER_SITE_OTHER): Payer: Self-pay | Admitting: Orthopaedic Surgery

## 2018-04-17 MED ORDER — OXYCODONE-ACETAMINOPHEN 5-325 MG PO TABS: 1.0000 | ORAL_TABLET | Freq: Four times a day (QID) | ORAL | 0 refills | Status: AC | PRN

## 2018-04-17 MED ORDER — METHOCARBAMOL 500 MG PO TABS: 500.0000 mg | ORAL_TABLET | Freq: Four times a day (QID) | ORAL | 0 refills | Status: AC | PRN

## 2018-04-17 NOTE — Telephone Encounter (Signed)
T[facility] C[facility]ingsraig (PT) [facility] left voicemail message advised started therapy Wednesday following back surgery. He saw patient today. Patient is still complaining of pain. Pt got Rx's today pain medicine and muscle relaxer. Patient is also complaining about a lot more weakness in her legs. Patient describes it as there is nothing there in her legs when she tries to move or stand. Patient was not able to do much standing due to the pain as well as weakness in the legs. The number to contact Tasia CatchingsCraig is [phone]

## 2018-04-17 NOTE — Telephone Encounter (Signed)
Please advise 

## 2018-04-17 NOTE — Telephone Encounter (Signed)
Patient called stating that she will not have enough medication to last the weekend, but also stated that the Oxycodone is not strong enough to help with the pain, she is wanting to know if she could have something stronger.  If not, she needs an RX refill on her Oxycodone.  CB#[phone].  Thank you.

## 2018-04-17 NOTE — Telephone Encounter (Signed)
Script printed and at front desk for pick up. I called patient to advise.

## 2018-04-17 NOTE — Telephone Encounter (Signed)
Refill percocet same as before I will sign and she can pick up, also fill out robaxin 500mg  one po q 6 hrs prn spasms # 40  . thanks

## 2018-04-18 NOTE — Telephone Encounter (Signed)
I called pt and discussed. Making it to [place],has had BM,  has neighbor with her. She will call Monday and can be seen Monday afternoon maybe about 4 PM . Thanks using walker and has not fallen. Legs week. No drainage from incision. Trying to cut back on smoking.

## 2018-04-20 ENCOUNTER — Ambulatory Visit (INDEPENDENT_AMBULATORY_CARE_PROVIDER_SITE_OTHER): Payer: Medicare Other | Admitting: Orthopaedic Surgery

## 2018-04-20 ENCOUNTER — Ambulatory Visit (INDEPENDENT_AMBULATORY_CARE_PROVIDER_SITE_OTHER): Payer: Self-pay

## 2018-04-20 ENCOUNTER — Encounter (INDEPENDENT_AMBULATORY_CARE_PROVIDER_SITE_OTHER): Payer: Self-pay | Admitting: Orthopaedic Surgery

## 2018-04-20 VITALS — BP 150/93 | HR 92 | Temp 99.1°F | Ht 65.0 in | Wt 154.0 lb

## 2018-04-20 DIAGNOSIS — Z9889 Other specified postprocedural states: Secondary | ICD-10-CM

## 2018-04-20 DIAGNOSIS — M545 Low back pain: Secondary | ICD-10-CM | POA: Diagnosis not present

## 2018-04-20 DIAGNOSIS — G8929 Other chronic pain: Secondary | ICD-10-CM

## 2018-04-20 MED ORDER — NICOTINE 21 MG/24HR TD PT24: 21.0000 mg | MEDICATED_PATCH | Freq: Every day | TRANSDERMAL | 0 refills | Status: AC

## 2018-04-20 NOTE — Progress Notes (Signed)
Office Visit Note   Patient: Whitney Kelley           Date of Birth: 1941-09-18           MRN: [id number] Visit Date: 04/20/2018              Requested by: [place] [address] [place], [place] [address] PCP: Selina Cooley, MD   Assessment & Plan: Visit Diagnoses:  1. Chronic bilateral low back pain, with sciatica presence unspecified   2. History of lumbar laminectomy for spinal cord decompression     Plan: Patient likely has some postop hematoma causing increased pain.  She is taken pain medication and is had problems with nausea.  She is cut back on her smoking as I discussed with her on the phone and her temperature is 99.1.  We discussed using the walker to prevent falling.  I plan to recheck her in 2 weeks.  She should continue to improve gradually.  Refill given Percocet 30 tablets she is gradually weaning.  She likely has some hematoma formation giving her more pain which should resolve with time.  Recheck 2 weeks.  Follow-Up Instructions: Return in about 2 weeks (around 05/04/2018).   Orders:  Orders Placed This Encounter  Procedures  . XR Lumbar Spine 2-3 Views   Meds ordered this encounter  Medications  . nicotine (NICODERM CQ) 21 mg/24hr patch    Sig: Place 1 patch (21 mg total) onto the skin daily.    Dispense:  28 patch    Refill:  0      Procedures: No procedures performed   Clinical Data: No additional findings.   Subjective: Chief Complaint  Patient presents with  . Lower Back - Pain    HPI 77 year old female returns post L3-4 central decompression with microdiscectomy performed.  She is had considerable problems standing up she had a very skinny walker but cannot use it and trips on it.  She states there is a woman staying with her who has a walker that she could use which is a standard walker with 5 inch front wheels.  Review of Systems unchanged from surgery.   Objective: Vital Signs: BP (!) 150/93   Pulse 92   Temp 99.1 F  (37.3 C)   Ht 5\' 5"  (1.651 m)   Wt 154 lb (69.9 kg)   BMI 25.63 kg/m   Physical Exam patient has some puffiness of her incision.  There is some ecchymosis that extends down to the sacrum.  Adductor, bilateral quads, anterior tib gastrocsoleus is strong intact and takes good resistive testing.  No sensory deficit.  Ortho Exam  Specialty Comments:  No specialty comments available.  Imaging: Xr Lumbar Spine 2-3 Views  Result Date: 04/20/2018 AP lateral lumbar spine x-rays obtained and reviewed.  This shows multilevel degenerative changes.  L3-4 decompression posteriorly is noted.  No listhesis is noted at this level.  No endplate changes that would suggest acute injury. Impression: Comparison films show L3-4 central decompression.     PMFS History: Patient Active Problem List   Diagnosis Date Noted  . History of lumbar laminectomy for spinal cord decompression 04/13/2018  . Spinal stenosis of lumbar region 04/13/2018   Past Medical History:  Diagnosis Date  . Acid reflux   . Alcoholism (HCC)   . Anemia   . Anxiety   . Arthritis   . Asthma   . Ataxia   . Closed fracture of right ankle with routine healing   .  COPD (chronic obstructive pulmonary disease) (HCC)   . Depression   . Dizziness   . HNP (herniated nucleus pulposus), lumbar   . Lumbar stenosis    L3-4  . Major depressive disorder   . Pneumonia   . Sleep apnea    No Cpap use  . Tobacco dependence     History reviewed. No pertinent family history.  Past Surgical History:  Procedure Laterality Date  . ABDOMINAL HYSTERECTOMY    . BACK SURGERY    . BLADDER SURGERY     x3  . CERVICAL SPINE SURGERY     X2  . FOOT SURGERY    . LUMBAR LAMINECTOMY/DECOMPRESSION MICRODISCECTOMY N/A 04/13/2018   Procedure: L3-4 CENTRAL DECOMPRESSION, MICRODISCECTOMY;  Surgeon: Eldred MangesYates, Tmya Wigington C, MD;  [facility];  Service: Orthopedics;  Laterality: N/A;  . SPINAL FUSION     cervical x 2   Social History   Occupational History    . Not on file  Tobacco Use  . Smoking status: Current Every Day Smoker    Packs/day: 0.50  . Smokeless tobacco: Never Used  Substance and Sexual Activity  . Alcohol use: Not Currently  . Drug use: Never  . Sexual activity: Not on file

## 2018-04-24 ENCOUNTER — Inpatient Hospital Stay (INDEPENDENT_AMBULATORY_CARE_PROVIDER_SITE_OTHER): Payer: Medicare Other | Admitting: Orthopaedic Surgery

## 2018-04-30 NOTE — Discharge Summary (Signed)
Patient ID: Whitney Kelley MRN: [id number] DOB/AGE: 75/05/1941 75 y.o.  Admit date: 04/13/2018 Discharge date: 04/30/2018  Admission Diagnoses:  Active Problems:   History of lumbar laminectomy for spinal cord decompression   Spinal stenosis of lumbar region   Discharge Diagnoses:  Active Problems:   History of lumbar laminectomy for spinal cord decompression   Spinal stenosis of lumbar region  status post Procedure(s): L3-4 CENTRAL DECOMPRESSION, MICRODISCECTOMY  Past Medical History:  Diagnosis Date  . Acid reflux   . Alcoholism (HCC)   . Anemia   . Anxiety   . Arthritis   . Asthma   . Ataxia   . Closed fracture of right ankle with routine healing   . COPD (chronic obstructive pulmonary disease) (HCC)   . Depression   . Dizziness   . HNP (herniated nucleus pulposus), lumbar   . Lumbar stenosis    L3-4  . Major depressive disorder   . Pneumonia   . Sleep apnea    No Cpap use  . Tobacco dependence     Surgeries: Procedure(s): L3-4 CENTRAL DECOMPRESSION, MICRODISCECTOMY on 04/13/2018   Consultants:   Discharged Condition: Improved  Hospital Course: Whitney Kelley is an 77 y.o. female who was admitted 04/13/2018 for operative treatment of lumbar hnp. Patient failed conservative treatments (please see the history and physical for the specifics) and had severe unremitting pain that affects sleep, daily activities and work/hobbies. After pre-op clearance, the patient was taken to the operating room on 04/13/2018 and underwent  Procedure(s): L3-4 CENTRAL DECOMPRESSION, MICRODISCECTOMY.    Patient was given perioperative antibiotics:  Anti-infectives (From admission, onward)   Start     Dose/Rate Route Frequency Ordered Stop   04/14/18 0600  vancomycin (VANCOCIN) IVPB 1000 mg/200 mL premix     1,000 mg 200 mL/hr over 60 Minutes Intravenous On call to O.R. 04/13/18 1311 04/14/18 0604   04/14/18 0200  vancomycin (VANCOCIN) IVPB 1000 mg/200 mL premix     1,000  mg 200 mL/hr over 60 Minutes Intravenous  Once 04/13/18 1848 04/14/18 0203   04/13/18 2000  ciprofloxacin (CIPRO) tablet 500 mg  Status:  Discontinued     500 mg Oral 2 times daily 04/13/18 1922 04/14/18 1635       Patient was given sequential compression devices and early ambulation to prevent DVT.   Patient benefited maximally from hospital stay and there were no complications. At the time of discharge, the patient was urinating/moving their bowels without difficulty, tolerating a regular diet, pain is controlled with oral pain medications and they have been cleared by PT/OT.   Recent vital signs: No data found.   Recent laboratory studies: No results for input(s): WBC, HGB, HCT, PLT, NA, K, CL, CO2, BUN, CREATININE, GLUCOSE, INR, CALCIUM in the last 72 hours.  Invalid input(s): PT, 2   Discharge Medications:   Allergies as of 04/14/2018      Reactions   Atorvastatin Other (See Comments)   Patient uncertain of reaction Unknown.   Hydrocodone-acetaminophen Other (See Comments)   insomnia   Amoxicillin-pot Clavulanate Rash, Other (See Comments)   rash Has patient had a PCN reaction causing immediate rash, facial/tongue/throat swelling, SOB or lightheadedness with hypotension: Yes Has patient had a PCN reaction causing severe rash involving mucus membranes or skin necrosis: No Has patient had a PCN reaction that required hospitalization: No Has patient had a PCN reaction occurring within the last 10 years: No If all of the above answers are "NO", then may proceed  with Cephalosporin use.   Diclofenac-misoprostol Diarrhea, Nausea Only   Prednisone Nausea And Vomiting      Medication List    STOP taking these medications   ciprofloxacin 500 MG tablet Commonly known as:  CIPRO   diazepam 5 MG tablet Commonly known as:  VALIUM   levofloxacin 500 MG tablet Commonly known as:  LEVAQUIN     TAKE these medications   albuterol 108 (90 Base) MCG/ACT inhaler Commonly known as:   PROVENTIL HFA;VENTOLIN HFA Inhale 2 puffs into the lungs every 4 (four) hours as needed for wheezing or shortness of breath.   budesonide-formoterol 160-4.5 MCG/ACT inhaler Commonly known as:  SYMBICORT Inhale 2 puffs into the lungs 2 (two) times daily.   D 1000 1000 units capsule Generic drug:  Cholecalciferol Take 1,000 Units by mouth daily.   Potassium 99 MG Tabs Take 99 mg by mouth daily.   pyridOXINE 100 MG tablet Commonly known as:  VITAMIN B-6 Take 100 mg by mouth daily.   ranitidine 150 MG tablet Commonly known as:  ZANTAC Take 150 mg by mouth 2 (two) times daily.   venlafaxine XR 75 MG 24 hr capsule Commonly known as:  EFFEXOR-XR Take 75 mg by mouth 3 (three) times daily.   vitamin B-1 250 MG tablet Take 250 mg by mouth daily.   Vitamin B-12 2500 MCG Subl Place 2,500 mcg under the tongue daily.   vitamin C 1000 MG tablet Take 1,000 mg by mouth daily.   vitamin E 400 UNIT capsule Take 800 Units by mouth daily.       Diagnostic Studies: Dg Lumbar Spine 2-3 Views  Result Date: 04/13/2018 CLINICAL DATA:  Localization film for L4-5 discectomy EXAM: LUMBAR SPINE - 2-3 VIEW COMPARISON:  lumbar MRI 02/18/2018 FINDINGS: Spinal numbering as on the previous MRI. 3 intraprocedural images were submitted. Spinal needles followed by clamps for localization of spinous processes. On the final image from 2 clamps overlap the L2 and L3 spinous processes. IMPRESSION: Intraoperative localization with instruments overlapping the L2 and L3 spinous processes on the final image. Electronically Signed   By: Marnee Spring M.D.   On: 04/13/2018 16:13   Xr Lumbar Spine 2-3 Views  Result Date: 04/20/2018 AP lateral lumbar spine x-rays obtained and reviewed.  This shows multilevel degenerative changes.  L3-4 decompression posteriorly is noted.  No listhesis is noted at this level.  No endplate changes that would suggest acute injury. Impression: Comparison films show L3-4 central  decompression.      Follow-up Information    Eldred Manges, MD Follow up in 1 week(s).   Specialty:  Orthopedic Surgery Contact information: [address] [place] [address] [phone]        [facility] - Dme Follow up.   Contact information: [address] [place] [address] [phone]        [facility]-Home Follow up.   Specialty:  Home Health Services Why:   representative from [facility] will contact you to arrange start date and time for your therapy. Contact information: [address] [place] [address] [phone]           Discharge Plan:  discharge to HOME  Disposition:     Signed: Zonia Kief  04/30/2018, 10:46 AM

## 2018-05-05 ENCOUNTER — Encounter (INDEPENDENT_AMBULATORY_CARE_PROVIDER_SITE_OTHER): Payer: Self-pay | Admitting: Orthopaedic Surgery

## 2018-05-05 ENCOUNTER — Ambulatory Visit (INDEPENDENT_AMBULATORY_CARE_PROVIDER_SITE_OTHER): Payer: Medicare Other | Admitting: Orthopaedic Surgery

## 2018-05-05 VITALS — BP 151/93 | HR 91 | Ht 65.0 in | Wt 154.0 lb

## 2018-05-05 DIAGNOSIS — Z9889 Other specified postprocedural states: Secondary | ICD-10-CM

## 2018-05-05 NOTE — Progress Notes (Signed)
   Post-Op Visit Note   Patient: Whitney Kelley           Date of Birth: 01/05/1941           MRN: [id number] Visit Date: 05/05/2018 PCP: Selina CooleyWitten, Bobby, MD   Assessment & Plan: Post decompression L3-4.  Staples harvested today.  She can stand 4 to 5 minutes at this point and has a cane but does not have it with her today.  We will stop her oxycodone and switch to Ultram # 20 tabs..  She can just take 1 a day as needed.  I will recheck her in 1 month and she will try to increase her walking as tolerated.  Chief Complaint:  Chief Complaint  Patient presents with  . Lower Back - Follow-up   Visit Diagnoses:  1. History of lumbar laminectomy for spinal cord decompression     Plan: Staples removed today.  She will continue to work on walking and I will recheck her in 1 month.  Follow-Up Instructions: Return in about 1 month (around 06/05/2018).   Orders:  No orders of the defined types were placed in this encounter.  No orders of the defined types were placed in this encounter.   Imaging: No results found.  PMFS History: Patient Active Problem List   Diagnosis Date Noted  . History of lumbar laminectomy for spinal cord decompression 04/13/2018  . Spinal stenosis of lumbar region 04/13/2018   Past Medical History:  Diagnosis Date  . Acid reflux   . Alcoholism (HCC)   . Anemia   . Anxiety   . Arthritis   . Asthma   . Ataxia   . Closed fracture of right ankle with routine healing   . COPD (chronic obstructive pulmonary disease) (HCC)   . Depression   . Dizziness   . HNP (herniated nucleus pulposus), lumbar   . Lumbar stenosis    L3-4  . Major depressive disorder   . Pneumonia   . Sleep apnea    No Cpap use  . Tobacco dependence     No family history on file.  Past Surgical History:  Procedure Laterality Date  . ABDOMINAL HYSTERECTOMY    . BACK SURGERY    . BLADDER SURGERY     x3  . CERVICAL SPINE SURGERY     X2  . FOOT SURGERY    . LUMBAR  LAMINECTOMY/DECOMPRESSION MICRODISCECTOMY N/A 04/13/2018   Procedure: L3-4 CENTRAL DECOMPRESSION, MICRODISCECTOMY;  Surgeon: Eldred MangesYates, Raul Torrance C, MD;  [facility];  Service: Orthopedics;  Laterality: N/A;  . SPINAL FUSION     cervical x 2   Social History   Occupational History  . Not on file  Tobacco Use  . Smoking status: Current Every Day Smoker    Packs/day: 0.50  . Smokeless tobacco: Never Used  Substance and Sexual Activity  . Alcohol use: Not Currently  . Drug use: Never  . Sexual activity: Not on file

## 2018-05-14 ENCOUNTER — Telehealth (INDEPENDENT_AMBULATORY_CARE_PROVIDER_SITE_OTHER): Payer: Self-pay | Admitting: Orthopaedic Surgery

## 2018-05-14 NOTE — Telephone Encounter (Signed)
OK - thanks

## 2018-05-14 NOTE — Telephone Encounter (Signed)
Please advise. Would you like for me to make patient an earlier appointment?  Next scheduled appointment is 06/03/18.

## 2018-05-14 NOTE — Telephone Encounter (Signed)
I called Tasia CatchingsCraig and advised. He states that patient was very active yesterday so she was having a little more pain today, but that it is more in her hip and groin. I advised we would assess the hip when she comes back into the office if she continues to have problems.

## 2018-05-14 NOTE — Telephone Encounter (Signed)
[facility] called requesting [facility] for continue PT for 2x a week for 4 weeks due to increased pain, weakness, and decreased balance.  He also wanted to let Dr. Ophelia CharterYates know about her Left hip and weakness.  He wanted to know if Dr. Ophelia CharterYates would possible want to x-ray her hip.  CB#[phone].  Thank you.

## 2018-06-03 ENCOUNTER — Encounter (INDEPENDENT_AMBULATORY_CARE_PROVIDER_SITE_OTHER): Payer: Self-pay | Admitting: Orthopaedic Surgery

## 2018-06-03 ENCOUNTER — Ambulatory Visit (INDEPENDENT_AMBULATORY_CARE_PROVIDER_SITE_OTHER): Payer: Medicare Other | Admitting: Orthopaedic Surgery

## 2018-06-03 VITALS — BP 145/91 | HR 77 | Ht 65.0 in | Wt 154.0 lb

## 2018-06-03 DIAGNOSIS — Z9889 Other specified postprocedural states: Secondary | ICD-10-CM

## 2018-06-03 NOTE — Progress Notes (Signed)
   Post-Op Visit Note   Patient: Whitney Kelley           Date of Birth: [birth year]           MRN: [id number] Visit Date: 06/03/2018 PCP: Selina Cooley, MD   Assessment & Plan: Post L3-4 decompression and microdiscectomy.  Using Aleve states she is walking much better she still has some pain at the incision but states she is ambulating in the community.  Still has some trouble with the left leg as before but notes is gradually improving with every week.  She had some home therapy which was beneficial.  Chief Complaint:  Chief Complaint  Patient presents with  . Lower Back - Follow-up    04/13/18  L3-4 Central Decompression, Microdiscectomy   Visit Diagnoses: Post L3-4 central decompression microdiscectomy.  Continue walking program.  She requested a prescription for NicoDerm patch which was provided.  Plan: I plan to recheck her in 4 months.  Follow-Up Instructions: No follow-ups on file.   Orders:  No orders of the defined types were placed in this encounter.  No orders of the defined types were placed in this encounter.   Imaging: No results found.  PMFS History: Patient Active Problem List   Diagnosis Date Noted  . History of lumbar laminectomy for spinal cord decompression 04/13/2018  . Spinal stenosis of lumbar region 04/13/2018   Past Medical History:  Diagnosis Date  . Acid reflux   . Alcoholism (HCC)   . Anemia   . Anxiety   . Arthritis   . Asthma   . Ataxia   . Closed fracture of right ankle with routine healing   . COPD (chronic obstructive pulmonary disease) (HCC)   . Depression   . Dizziness   . HNP (herniated nucleus pulposus), lumbar   . Lumbar stenosis    L3-4  . Major depressive disorder   . Pneumonia   . Sleep apnea    No Cpap use  . Tobacco dependence     No family history on file.  Past Surgical History:  Procedure Laterality Date  . ABDOMINAL HYSTERECTOMY    . BACK SURGERY    . BLADDER SURGERY     x3  . CERVICAL SPINE SURGERY     X2  . FOOT SURGERY    . LUMBAR LAMINECTOMY/DECOMPRESSION MICRODISCECTOMY N/A 04/13/2018   Procedure: L3-4 CENTRAL DECOMPRESSION, MICRODISCECTOMY;  Surgeon: Eldred Manges, MD;  Location: [facility] OR;  Service: Orthopedics;  Laterality: N/A;  . SPINAL FUSION     cervical x 2   Social History   Occupational History  . Not on file  Tobacco Use  . Smoking status: Current Every Day Smoker    Packs/day: 0.50  . Smokeless tobacco: Never Used  Substance and Sexual Activity  . Alcohol use: Not Currently  . Drug use: Never  . Sexual activity: Not on file

## 2018-09-30 ENCOUNTER — Ambulatory Visit (INDEPENDENT_AMBULATORY_CARE_PROVIDER_SITE_OTHER): Payer: Medicare Other | Admitting: Orthopaedic Surgery

## 2018-09-30 ENCOUNTER — Encounter (INDEPENDENT_AMBULATORY_CARE_PROVIDER_SITE_OTHER): Payer: Self-pay | Admitting: Orthopaedic Surgery

## 2018-09-30 VITALS — BP 149/96 | HR 73 | Temp 97.5°F | Ht 65.0 in | Wt 154.0 lb

## 2018-09-30 DIAGNOSIS — M7541 Impingement syndrome of right shoulder: Secondary | ICD-10-CM | POA: Insufficient documentation

## 2018-09-30 DIAGNOSIS — Z9889 Other specified postprocedural states: Secondary | ICD-10-CM

## 2018-09-30 MED ORDER — METHYLPREDNISOLONE ACETATE 40 MG/ML IJ SUSP
40.0000 mg | INTRAMUSCULAR | Status: AC | PRN
  Administered 2018-09-30: 40 mg via INTRA_ARTICULAR

## 2018-09-30 MED ORDER — BUPIVACAINE HCL 0.25 % IJ SOLN
4.0000 mL | INTRAMUSCULAR | Status: AC | PRN
  Administered 2018-09-30: 4 mL via INTRA_ARTICULAR

## 2018-09-30 MED ORDER — LIDOCAINE HCL 1 % IJ SOLN
0.5000 mL | INTRAMUSCULAR | Status: AC | PRN
  Administered 2018-09-30: .5 mL

## 2018-09-30 NOTE — Progress Notes (Signed)
Office Visit Note   Patient: Whitney Kelley           Date of Birth: 06/30/1941           MRN: [id number] Visit Date: 09/30/2018              Requested by: Selina CooleyWitten, Bobby, MD [id number] [place], [place] [id number] PCP: Selina CooleyWitten, Bobby, MD   Assessment & Plan: Visit Diagnoses:  1. Impingement syndrome of right shoulder   2. History of lumbar laminectomy for spinal cord decompression     Plan: Post L3-4 decompression with improvement of claudication symptoms.  She got some relief with the subacromial injection right shoulder and will call if she has persistent problems.  She will work on walking and lower extremity strengthening.  Follow-Up Instructions: Return if symptoms worsen or fail to improve.   Orders:  Orders Placed This Encounter  Procedures  . Large Joint Inj: R subacromial bursa   No orders of the defined types were placed in this encounter.     Procedures: Large Joint Inj: R subacromial bursa on 09/30/2018 11:11 AM Indications: pain Details: 22 G 1.5 in needle  Arthrogram: No  Medications: 4 mL bupivacaine 0.25 %; 40 mg methylPREDNISolone acetate 40 MG/ML; 0.5 mL lidocaine 1 % Outcome: tolerated well, no immediate complications Procedure, treatment alternatives, risks and benefits explained, specific risks discussed. Consent was given by the patient. Immediately prior to procedure a time out was called to verify the correct patient, procedure, equipment, support staff and site/side marked as required. Patient was prepped and draped in the usual sterile fashion.       Clinical Data: No additional findings.   Subjective: Chief Complaint  Patient presents with  . Lower Back - Follow-up    HPI 78 year old female returns post 04/13/2018 L3-4 central decompression for spinal stenosis.  She states she can stand better and walk better but fatigues.  She still notes some weakness in the lower extremities and gets relief with sitting.  She takes some Aleve  occasionally.  She did physical therapy for 6 weeks but is started having problems with her right shoulder which she uses to push herself up from a chair.  She is able to get her arm up overhead but has discomfort with outstretched reaching and overhead activities.  No previous history of injury to her right shoulder.  Review of Systems 14 point update unchanged from 02/24/2018 visit other than as mentioned above.  Of note is continued long-term smoking history, COPD, depression.  Objective: Vital Signs: BP (!) 149/96 (BP Location: Left Arm, Patient Position: Sitting, Cuff Size: Normal)   Pulse 73   Temp (!) 97.5 F (36.4 C) (Oral)   Ht 5\' 5"  (1.651 m)   Wt 154 lb (69.9 kg)   BMI 25.63 kg/m   Physical Exam Constitutional:      Appearance: She is well-developed.  HENT:     Head: Normocephalic.     Right Ear: External ear normal.     Left Ear: External ear normal.  Eyes:     Pupils: Pupils are equal, round, and reactive to light.  Neck:     Thyroid: No thyromegaly.     Trachea: No tracheal deviation.  Cardiovascular:     Rate and Rhythm: Normal rate.  Pulmonary:     Effort: Pulmonary effort is normal.  Abdominal:     Palpations: Abdomen is soft.  Skin:    General: Skin is warm and dry.  Neurological:  Mental Status: She is alert and oriented to person, place, and time.  Psychiatric:        Behavior: Behavior normal.     Ortho Exam lumbar incisions well-healed.  Positive impingement right shoulder negative left shoulder.  Long head of the biceps is minimally tender.  Negative drop arm test.  Discomfort with internal rotation with hand to mid axillary line with internal rotation.  No subscap or external rotation weakness.  Specialty Comments:  No specialty comments available.  Imaging: No results found.   PMFS History: Patient Active Problem List   Diagnosis Date Noted  . Impingement syndrome of right shoulder 09/30/2018  . History of lumbar laminectomy for spinal  cord decompression 04/13/2018  . Spinal stenosis of lumbar region 04/13/2018   Past Medical History:  Diagnosis Date  . Acid reflux   . Alcoholism (HCC)   . Anemia   . Anxiety   . Arthritis   . Asthma   . Ataxia   . Closed fracture of right ankle with routine healing   . COPD (chronic obstructive pulmonary disease) (HCC)   . Depression   . Dizziness   . HNP (herniated nucleus pulposus), lumbar   . Lumbar stenosis    L3-4  . Major depressive disorder   . Pneumonia   . Sleep apnea    No Cpap use  . Tobacco dependence     No family history on file.  Past Surgical History:  Procedure Laterality Date  . ABDOMINAL HYSTERECTOMY    . BACK SURGERY    . BLADDER SURGERY     x3  . CERVICAL SPINE SURGERY     X2  . FOOT SURGERY    . LUMBAR LAMINECTOMY/DECOMPRESSION MICRODISCECTOMY N/A 04/13/2018   Procedure: L3-4 CENTRAL DECOMPRESSION, MICRODISCECTOMY;  Surgeon: Eldred Manges, MD;  Location: [facility] OR;  Service: Orthopedics;  Laterality: N/A;  . SPINAL FUSION     cervical x 2   Social History   Occupational History  . Not on file  Tobacco Use  . Smoking status: Current Every Day Smoker    Packs/day: 0.50  . Smokeless tobacco: Never Used  Substance and Sexual Activity  . Alcohol use: Not Currently  . Drug use: Never  . Sexual activity: Not on file

## 2019-06-05 IMAGING — CR DG LUMBAR SPINE 2-3V
3 series · 3 of 3 positions shown · non-contrast
Comparison: lumbar MRI 02/18/2018

CLINICAL DATA: Localization film for L4-5 discectomy

EXAM:
LUMBAR SPINE - 2-3 VIEW

[xtable lateral (1 of 3)]
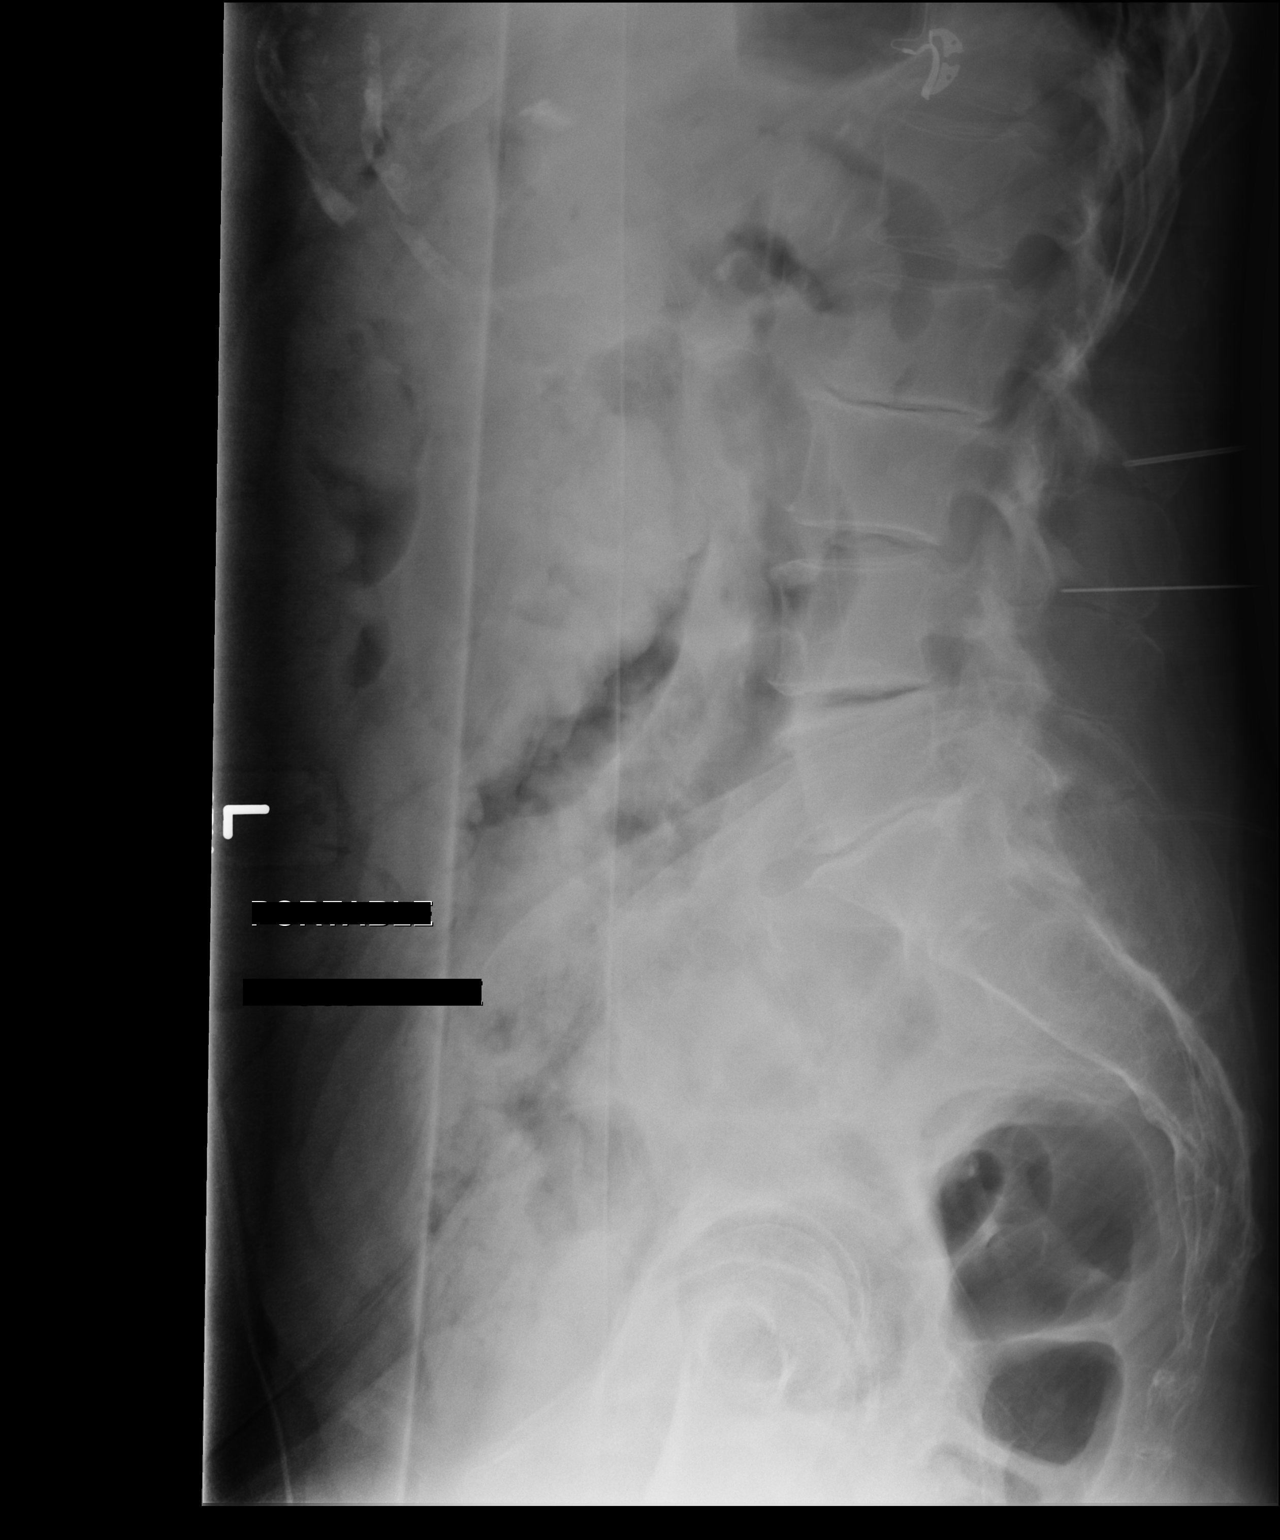

[xtable lateral (2 of 3)]
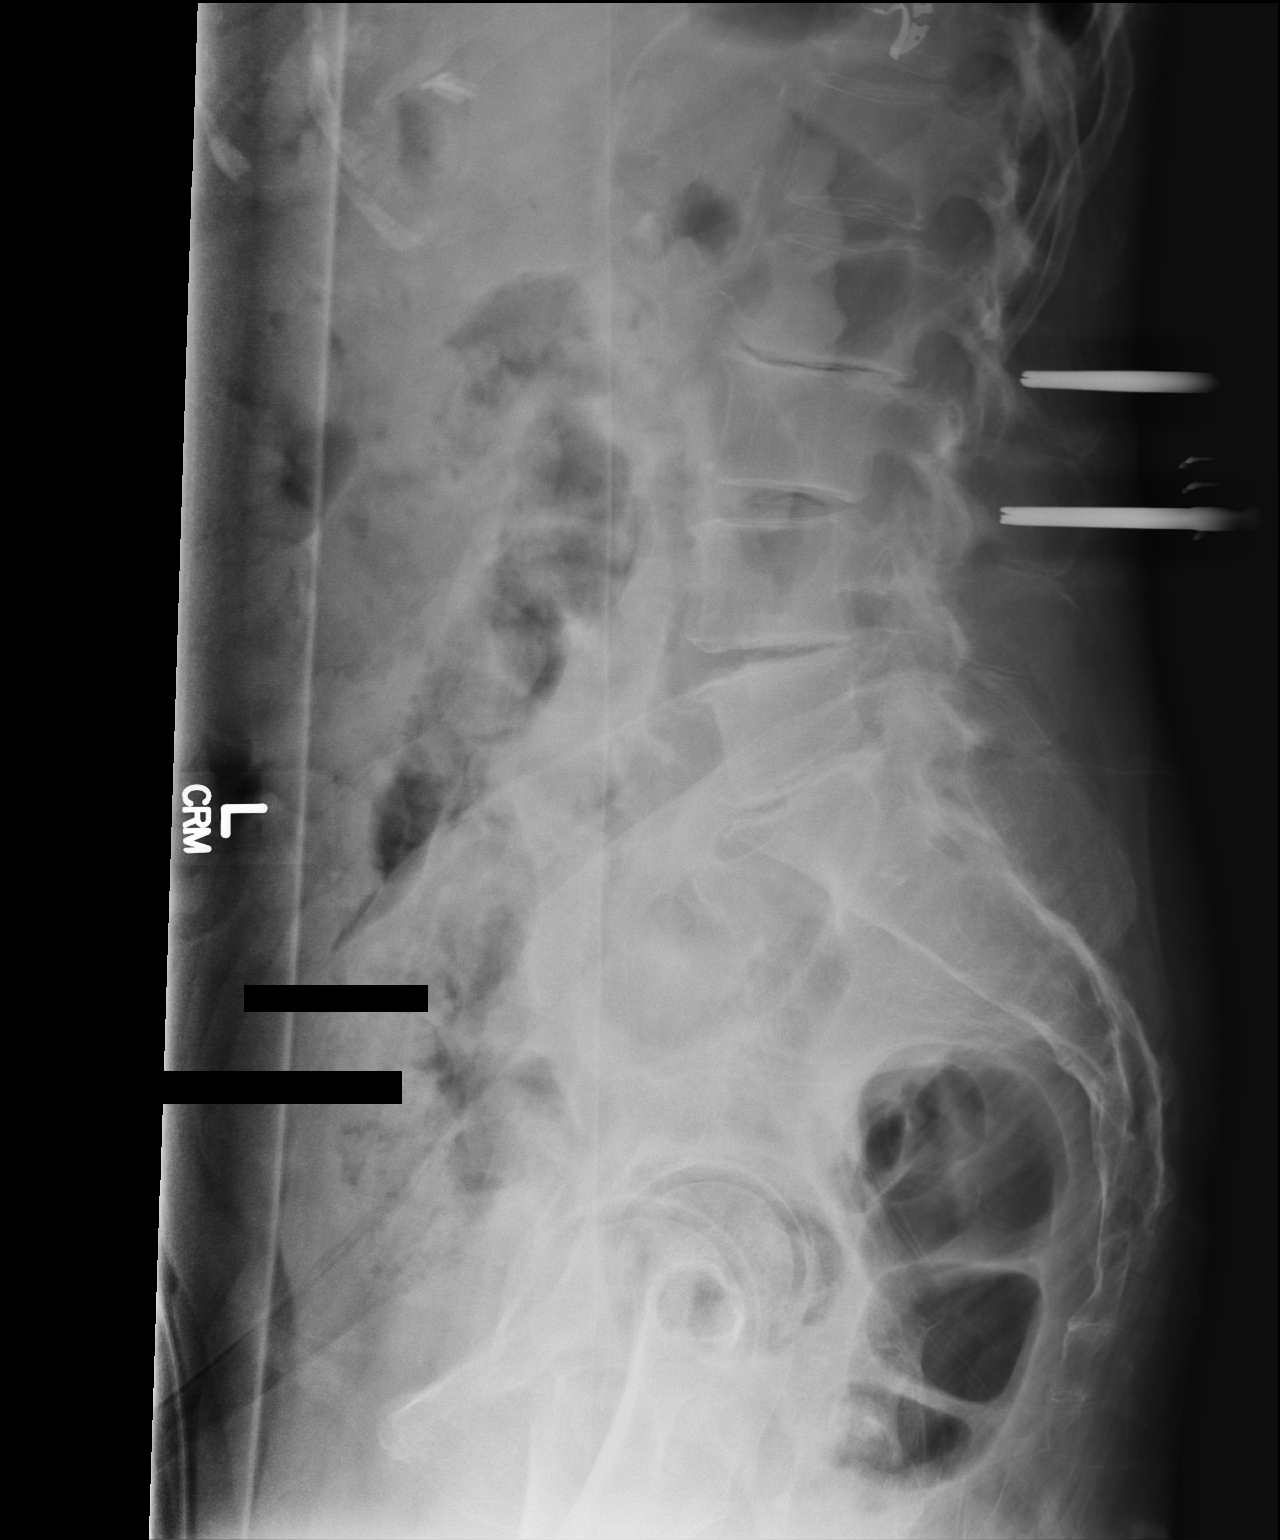

[xtable lateral (3 of 3)]
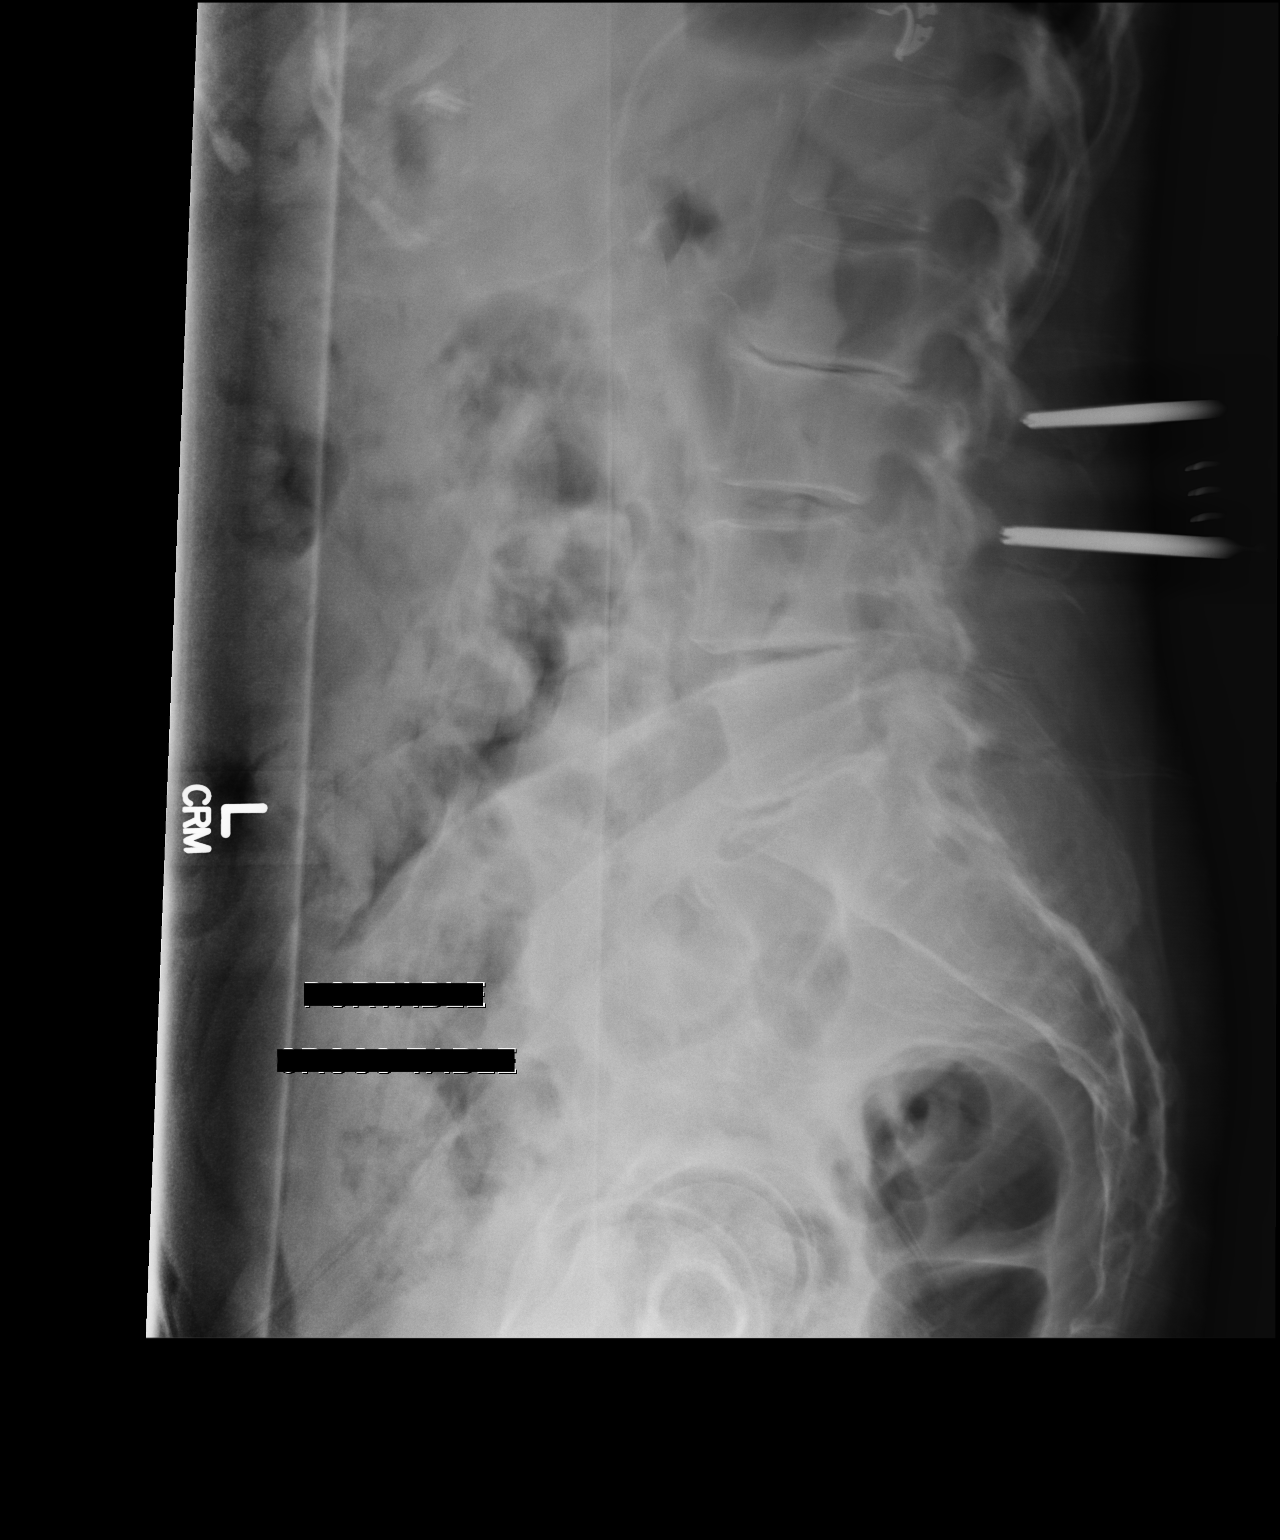

[3 of 3 positions shown; findings below may reference images not displayed]

FINDINGS: Spinal numbering as on the previous MRI.

3 intraprocedural images were submitted. Spinal needles followed by
clamps for localization of spinous processes. On the final image
from 2 clamps overlap the L2 and L3 spinous processes.
IMPRESSION: Intraoperative localization with instruments overlapping the L2 and
L3 spinous processes on the final image.

## 2020-11-03 ENCOUNTER — Emergency Department (HOSPITAL_COMMUNITY)
Admission: EM | Admit: 2020-11-03 | Discharge: 2020-11-04 | Disposition: A | Discharge status: Home / Self Care | Payer: Medicare Other | Attending: Emergency Medicine | Admitting: Emergency Medicine

## 2020-11-03 ENCOUNTER — Other Ambulatory Visit: Payer: Self-pay

## 2020-11-03 ENCOUNTER — Emergency Department (HOSPITAL_COMMUNITY): Payer: Medicare Other

## 2020-11-03 DIAGNOSIS — Z7951 Long term (current) use of inhaled steroids: Secondary | ICD-10-CM | POA: Insufficient documentation

## 2020-11-03 DIAGNOSIS — J449 Chronic obstructive pulmonary disease, unspecified: Secondary | ICD-10-CM | POA: Insufficient documentation

## 2020-11-03 DIAGNOSIS — R079 Chest pain, unspecified: Secondary | ICD-10-CM | POA: Diagnosis not present

## 2020-11-03 DIAGNOSIS — F17200 Nicotine dependence, unspecified, uncomplicated: Secondary | ICD-10-CM | POA: Insufficient documentation

## 2020-11-03 DIAGNOSIS — J45909 Unspecified asthma, uncomplicated: Secondary | ICD-10-CM | POA: Diagnosis not present

## 2020-11-03 DIAGNOSIS — Z20822 Contact with and (suspected) exposure to covid-19: Secondary | ICD-10-CM | POA: Insufficient documentation

## 2020-11-03 DIAGNOSIS — R0789 Other chest pain: Secondary | ICD-10-CM

## 2020-11-03 LAB — BASIC METABOLIC PANEL
Anion gap: 16 — ABNORMAL HIGH (ref 5–15)
BUN: 6 mg/dL — ABNORMAL LOW (ref 8–23)
CO2: 25 mmol/L (ref 22–32)
Calcium: 9.2 mg/dL (ref 8.9–10.3)
Chloride: 85 mmol/L — ABNORMAL LOW (ref 98–111)
Creatinine, Ser: 0.47 mg/dL (ref 0.44–1.00)
GFR, Estimated: >60 mL/min (ref >60)
Glucose, Bld: 136 mg/dL — ABNORMAL HIGH (ref 70–99)
Potassium: 3.9 mmol/L (ref 3.5–5.1)
Sodium: 126 mmol/L — ABNORMAL LOW (ref 135–145)

## 2020-11-03 LAB — CBC
HCT: 40.5 % (ref 36.0–46.0)
Hemoglobin: 14.3 g/dL (ref 12.0–15.0)
MCH: 35 pg — ABNORMAL HIGH (ref 26.0–34.0)
MCHC: 35.3 g/dL (ref 30.0–36.0)
MCV: 99.3 fL (ref 80.0–100.0)
Platelets: 178 10*3/uL (ref 150–400)
RBC: 4.08 MIL/uL (ref 3.87–5.11)
RDW: 12.9 % (ref 11.5–15.5)
WBC: 9.9 10*3/uL (ref 4.0–10.5)
nRBC: 0 % (ref 0.0–0.2)

## 2020-11-03 LAB — TROPONIN I (HIGH SENSITIVITY)
Troponin I (High Sensitivity): 5 ng/L (ref <18)
Troponin I (High Sensitivity): 8 ng/L (ref <18)

## 2020-11-03 NOTE — ED Triage Notes (Signed)
Pt presents to ED POV. Pt c/o L CP that radiates towards back. Pt reports that pain began today after coughing fit. Pt reports pain w/ respiration now. Pt's mildly labored in triage. No cardiac hx.

## 2020-11-03 NOTE — ED Notes (Signed)
Vicky Collie (Sister#[phone]) called for an update on patient's status.  Thank you

## 2020-11-03 NOTE — ED Notes (Signed)
Pt's BP was 196/102. Informed Melanie- RN

## 2020-11-03 NOTE — ED Notes (Signed)
Cyndi, daughter, [phone] would like to speak w pt

## 2020-11-04 LAB — SARS CORONAVIRUS 2 (TAT 6-24 HRS): SARS Coronavirus 2: NEGATIVE

## 2020-11-04 LAB — TROPONIN I (HIGH SENSITIVITY): Troponin I (High Sensitivity): 9 ng/L (ref <18)

## 2020-11-04 MED ORDER — TRAMADOL HCL 50 MG PO TABS
50.0000 mg | ORAL_TABLET | Freq: Once | ORAL | Status: AC
  Administered 2020-11-04: 50 mg via ORAL
  Filled 2020-11-04: qty 1

## 2020-11-04 MED ORDER — TRAMADOL HCL 50 MG PO TABS: 50.0000 mg | ORAL_TABLET | Freq: Four times a day (QID) | ORAL | 0 refills | Status: AC | PRN

## 2020-11-04 NOTE — ED Provider Notes (Signed)
[facility] EMERGENCY DEPARTMENT Provider Note   CSN: [id number] Arrival date & time: 11/03/20  1710     History Chief Complaint  Patient presents with  . Chest Pain    Whitney Kelley is a 80 y.o. female.  Patient is a 80 year old female presenting with complaints of chest pain.  Patient describes what she calls a "coughing fit" yesterday afternoon.  Shortly afterward, she developed severe pain to the left lateral chest.  This pain is worse when she moves, presses on the area, coughs, or breathes.  She denies fevers or chills.  She denies shortness of breath.  The history is provided by the patient.  Chest Pain Pain location:  L lateral chest Pain quality: sharp   Pain radiates to:  Does not radiate Pain severity:  Moderate Onset quality:  Sudden Timing:  Constant Progression:  Unchanged Context: breathing   Relieved by:  Nothing Worsened by:  Deep breathing and movement (Palpation) Ineffective treatments:  None tried      Past Medical History:  Diagnosis Date  . Acid reflux   . Alcoholism (HCC)   . Anemia   . Anxiety   . Arthritis   . Asthma   . Ataxia   . Closed fracture of right ankle with routine healing   . COPD (chronic obstructive pulmonary disease) (HCC)   . Depression   . Dizziness   . HNP (herniated nucleus pulposus), lumbar   . Lumbar stenosis    L3-4  . Major depressive disorder   . Pneumonia   . Sleep apnea    No Cpap use  . Tobacco dependence     Patient Active Problem List   Diagnosis Date Noted  . Impingement syndrome of right shoulder 09/30/2018  . History of lumbar laminectomy for spinal cord decompression 04/13/2018  . Spinal stenosis of lumbar region 04/13/2018    Past Surgical History:  Procedure Laterality Date  . ABDOMINAL HYSTERECTOMY    . BACK SURGERY    . BLADDER SURGERY     x3  . CERVICAL SPINE SURGERY     X2  . FOOT SURGERY    . LUMBAR LAMINECTOMY/DECOMPRESSION MICRODISCECTOMY N/A 04/13/2018    Procedure: L3-4 CENTRAL DECOMPRESSION, MICRODISCECTOMY;  Surgeon: Eldred Manges, MD;  Location: [facility] OR;  Service: Orthopedics;  Laterality: N/A;  . SPINAL FUSION     cervical x 2     OB History   No obstetric history on file.     No family history on file.  Social History   Tobacco Use  . Smoking status: Current Every Day Smoker    Packs/day: 0.50  . Smokeless tobacco: Never Used  Vaping Use  . Vaping Use: Never used  Substance Use Topics  . Alcohol use: Not Currently  . Drug use: Never    Home Medications Prior to Admission medications   Medication Sig Start Date End Date Taking? Authorizing Provider  albuterol (PROVENTIL HFA;VENTOLIN HFA) 108 (90 Base) MCG/ACT inhaler Inhale 2 puffs into the lungs every 4 (four) hours as needed for wheezing or shortness of breath.    [provider]  Ascorbic Acid (VITAMIN C) 1000 MG tablet Take 1,000 mg by mouth daily.    [provider]  budesonide-formoterol (SYMBICORT) 160-4.5 MCG/ACT inhaler Inhale 2 puffs into the lungs 2 (two) times daily.    [provider]  Cholecalciferol (D 1000) 1000 units capsule Take 1,000 Units by mouth daily.     [provider]  Cyanocobalamin (VITAMIN  B-12) 2500 MCG SUBL Place 2,500 mcg under the tongue daily.    [provider]  methocarbamol (ROBAXIN) 500 MG tablet Take 1 tablet (500 mg total) by mouth every 6 (six) hours as needed for muscle spasms. 04/17/18   Eldred Manges, MD  nicotine (NICODERM CQ) 21 mg/24hr patch Place 1 patch (21 mg total) onto the skin daily. 04/20/18   Eldred Manges, MD  Potassium 99 MG TABS Take 99 mg by mouth daily.    [provider]  pyridOXINE (VITAMIN B-6) 100 MG tablet Take 100 mg by mouth daily.    [provider]  ranitidine (ZANTAC) 150 MG tablet Take 150 mg by mouth 2 (two) times daily.  11/19/17   [provider]  Thiamine HCl (VITAMIN B-1) 250 MG tablet Take 250 mg by mouth daily.    [provider]  venlafaxine XR (EFFEXOR-XR) 75 MG 24 hr capsule Take 75 mg by mouth 3 (three) times daily.     [provider]  vitamin E 400 UNIT capsule Take 800 Units by mouth daily.    [provider]    Allergies    Atorvastatin, Hydrocodone-acetaminophen, Amoxicillin-pot clavulanate, Diclofenac-misoprostol, and Prednisone  Review of Systems   Review of Systems  Cardiovascular: Positive for chest pain.  All other systems reviewed and are negative.   Physical Exam Updated Vital Signs BP (!) 150/98   Pulse 75   Temp 99.5 F (37.5 C) (Oral)   Resp 16   SpO2 99%   Physical Exam Vitals and nursing note reviewed.  Constitutional:      General: She is not in acute distress.    Appearance: She is well-developed and well-nourished. She is not diaphoretic.  HENT:     Head: Normocephalic and atraumatic.  Cardiovascular:     Rate and Rhythm: Normal rate and regular rhythm.     Heart sounds: No murmur heard. No friction rub. No gallop.   Pulmonary:     Effort: Pulmonary effort is normal. No respiratory distress.     Breath sounds: Normal breath sounds. No wheezing.     Comments: There is tenderness to palpation over the left lateral ribs.  Breath sounds are clear and equal. Abdominal:     General: Bowel sounds are normal. There is no distension.     Palpations: Abdomen is soft.     Tenderness: There is no abdominal tenderness.  Musculoskeletal:        General: Normal range of motion.     Cervical back: Normal range of motion and neck supple.     Right lower leg: No tenderness. No edema.     Left lower leg: No tenderness. No edema.  Skin:    General: Skin is warm and dry.  Neurological:     Mental Status: She is alert and oriented to person, place, and time.     ED Results / Procedures / Treatments   Labs (all labs ordered are listed, but only abnormal results are displayed) Labs Reviewed  BASIC METABOLIC PANEL - Abnormal; Notable for the following  components:      Result Value   Sodium 126 (*)    Chloride 85 (*)    Glucose, Bld 136 (*)    BUN 6 (*)    Anion gap 16 (*)    All other components within normal limits  CBC - Abnormal; Notable for the following components:   MCH 35.0 (*)    All other components within normal limits  SARS CORONAVIRUS 2 (TAT 6-24 HRS)  TROPONIN I (HIGH SENSITIVITY)  TROPONIN I (HIGH SENSITIVITY)  TROPONIN I (HIGH SENSITIVITY)    EKG EKG Interpretation  Date/Time:  Saturday November 04 2020 05:46:46 EST Ventricular Rate:  74 PR Interval:    QRS Duration: 97 QT Interval:  432 QTC Calculation: 480 R Axis:   55 Text Interpretation: Sinus rhythm Multiple premature complexes, vent & supraven Nonspecific repol abnormality, diffuse leads No significant change since yesterday's ecg Confirmed by Geoffery Lyons ([id number]) on 11/04/2020 5:50:08 AM   Radiology DG Chest 2 View  Result Date: 11/03/2020 CLINICAL DATA:  Left-sided chest pain. EXAM: CHEST - 2 VIEW COMPARISON:  March 25, 2018 FINDINGS: The lungs are mildly hyperinflated. There is no evidence of acute infiltrate, pleural effusion or pneumothorax. The heart size and mediastinal contours are within normal limits. There is mild calcification of the aortic arch and tortuosity of the descending thoracic aorta. The visualized skeletal structures are unremarkable. Surgical clips are seen within the right upper quadrant. IMPRESSION: No active cardiopulmonary disease. Electronically Signed   By: Aram Candela M.D.   On: 11/03/2020 17:52    Procedures Procedures   Medications Ordered in ED Medications  traMADol (ULTRAM) tablet 50 mg (50 mg Oral Given 11/04/20 3383)    ED Course  I have reviewed the triage vital signs and the nursing notes.  Pertinent labs & imaging results that were available during my care of the patient were reviewed by me and considered in my medical decision making (see chart for details).    MDM Rules/Calculators/A&P  Patient  presenting here with pain to the left lateral chest that began after a coughing fit yesterday.  I highly suspect this to be musculoskeletal.  It is reproducible with palpation and when she breathes or coughs.  Her cardiac work-up is unremarkable including EKGs and troponin x3.  At this point, I feel as though discharge is appropriate.  Patient will be given tramadol and advised to follow-up with primary doctor if not improving.  A Covid test has been obtained as well to rule this out as the etiology for her coughing fit.  Final Clinical Impression(s) / ED Diagnoses Final diagnoses:  None    Rx / DC Orders ED Discharge Orders    None       Geoffery Lyons, MD 11/04/20 [phone]

## 2020-11-04 NOTE — Discharge Instructions (Addendum)
Begin taking tramadol as prescribed as needed for pain.  Rest.  Isolate at home until the results of your Covid test are known.  That should be this afternoon.  Return to the emergency department in the meantime if you develop worsening pain, high fever, difficulty breathing, or other new and concerning symptoms.

## 2020-11-06 ENCOUNTER — Telehealth: Payer: Self-pay | Admitting: *Deleted

## 2020-11-06 NOTE — Telephone Encounter (Signed)
Pharmacy called regarding additional Rx (as stated by patient).  RNCM reviewed chart to find only one Rx written and Pharmacy has filled it.

## 2021-04-22 ENCOUNTER — Emergency Department (HOSPITAL_BASED_OUTPATIENT_CLINIC_OR_DEPARTMENT_OTHER)
Admission: EM | Admit: 2021-04-22 | Discharge: 2021-04-22 | Disposition: A | Discharge status: Home / Self Care | Payer: Medicare Other | Attending: Emergency Medicine | Admitting: Emergency Medicine

## 2021-04-22 ENCOUNTER — Emergency Department (HOSPITAL_BASED_OUTPATIENT_CLINIC_OR_DEPARTMENT_OTHER): Payer: Medicare Other

## 2021-04-22 ENCOUNTER — Other Ambulatory Visit: Payer: Self-pay

## 2021-04-22 ENCOUNTER — Encounter (HOSPITAL_BASED_OUTPATIENT_CLINIC_OR_DEPARTMENT_OTHER): Payer: Self-pay | Admitting: Emergency Medicine

## 2021-04-22 DIAGNOSIS — W19XXXA Unspecified fall, initial encounter: Secondary | ICD-10-CM

## 2021-04-22 DIAGNOSIS — S93401A Sprain of unspecified ligament of right ankle, initial encounter: Secondary | ICD-10-CM | POA: Diagnosis not present

## 2021-04-22 DIAGNOSIS — F17210 Nicotine dependence, cigarettes, uncomplicated: Secondary | ICD-10-CM | POA: Insufficient documentation

## 2021-04-22 DIAGNOSIS — Z79899 Other long term (current) drug therapy: Secondary | ICD-10-CM | POA: Insufficient documentation

## 2021-04-22 DIAGNOSIS — F10229 Alcohol dependence with intoxication, unspecified: Secondary | ICD-10-CM | POA: Diagnosis not present

## 2021-04-22 DIAGNOSIS — S8001XA Contusion of right knee, initial encounter: Secondary | ICD-10-CM | POA: Insufficient documentation

## 2021-04-22 DIAGNOSIS — J45909 Unspecified asthma, uncomplicated: Secondary | ICD-10-CM | POA: Insufficient documentation

## 2021-04-22 DIAGNOSIS — S2232XA Fracture of one rib, left side, initial encounter for closed fracture: Secondary | ICD-10-CM | POA: Insufficient documentation

## 2021-04-22 DIAGNOSIS — R42 Dizziness and giddiness: Secondary | ICD-10-CM | POA: Diagnosis not present

## 2021-04-22 DIAGNOSIS — Y906 Blood alcohol level of 120-199 mg/100 ml: Secondary | ICD-10-CM | POA: Insufficient documentation

## 2021-04-22 DIAGNOSIS — Z7951 Long term (current) use of inhaled steroids: Secondary | ICD-10-CM | POA: Insufficient documentation

## 2021-04-22 DIAGNOSIS — W1830XA Fall on same level, unspecified, initial encounter: Secondary | ICD-10-CM | POA: Diagnosis not present

## 2021-04-22 DIAGNOSIS — J449 Chronic obstructive pulmonary disease, unspecified: Secondary | ICD-10-CM | POA: Insufficient documentation

## 2021-04-22 DIAGNOSIS — S299XXA Unspecified injury of thorax, initial encounter: Secondary | ICD-10-CM | POA: Diagnosis present

## 2021-04-22 LAB — CBC WITH DIFFERENTIAL/PLATELET
Abs Immature Granulocytes: 0.07 10*3/uL (ref 0.00–0.07)
Basophils Absolute: 0.1 10*3/uL (ref 0.0–0.1)
Basophils Relative: 1 %
Eosinophils Absolute: 0 10*3/uL (ref 0.0–0.5)
Eosinophils Relative: 0 %
HCT: 37.5 % (ref 36.0–46.0)
Hemoglobin: 12.6 g/dL (ref 12.0–15.0)
Immature Granulocytes: 1 %
Lymphocytes Relative: 18 %
Lymphs Abs: 1 10*3/uL (ref 0.7–4.0)
MCH: 35.9 pg — ABNORMAL HIGH (ref 26.0–34.0)
MCHC: 33.6 g/dL (ref 30.0–36.0)
MCV: 106.8 fL — ABNORMAL HIGH (ref 80.0–100.0)
Monocytes Absolute: 0.6 10*3/uL (ref 0.1–1.0)
Monocytes Relative: 12 %
Neutro Abs: 3.5 10*3/uL (ref 1.7–7.7)
Neutrophils Relative %: 68 %
Platelets: 170 10*3/uL (ref 150–400)
RBC: 3.51 MIL/uL — ABNORMAL LOW (ref 3.87–5.11)
RDW: 16 % — ABNORMAL HIGH (ref 11.5–15.5)
WBC: 5.3 10*3/uL (ref 4.0–10.5)
nRBC: 0 % (ref 0.0–0.2)

## 2021-04-22 LAB — COMPREHENSIVE METABOLIC PANEL
ALT: 80 U/L — ABNORMAL HIGH (ref 0–44)
AST: 124 U/L — ABNORMAL HIGH (ref 15–41)
Albumin: 4.2 g/dL (ref 3.5–5.0)
Alkaline Phosphatase: 67 U/L (ref 38–126)
Anion gap: 13 (ref 5–15)
BUN: 11 mg/dL (ref 8–23)
CO2: 25 mmol/L (ref 22–32)
Calcium: 8.4 mg/dL — ABNORMAL LOW (ref 8.9–10.3)
Chloride: 97 mmol/L — ABNORMAL LOW (ref 98–111)
Creatinine, Ser: <0.3 mg/dL — ABNORMAL LOW (ref 0.44–1.00)
Glucose, Bld: 79 mg/dL (ref 70–99)
Potassium: 3.5 mmol/L (ref 3.5–5.1)
Sodium: 135 mmol/L (ref 135–145)
Total Bilirubin: 0.7 mg/dL (ref 0.3–1.2)
Total Protein: 6.5 g/dL (ref 6.5–8.1)

## 2021-04-22 LAB — PROTIME-INR
INR: 0.9 (ref 0.8–1.2)
Prothrombin Time: 12.5 seconds (ref 11.4–15.2)

## 2021-04-22 LAB — ETHANOL: Alcohol, Ethyl (B): 193 mg/dL — ABNORMAL HIGH (ref <10)

## 2021-04-22 LAB — CBG MONITORING, ED: Glucose-Capillary: 73 mg/dL (ref 70–99)

## 2021-04-22 MED ORDER — LORAZEPAM 2 MG/ML IJ SOLN
2.0000 mg | Freq: Once | INTRAMUSCULAR | Status: AC
  Administered 2021-04-22: 2 mg via INTRAVENOUS
  Filled 2021-04-22: qty 1

## 2021-04-22 MED ORDER — THIAMINE HCL 100 MG/ML IJ SOLN
100.0000 mg | Freq: Once | INTRAMUSCULAR | Status: AC
  Administered 2021-04-22: 100 mg via INTRAVENOUS
  Filled 2021-04-22: qty 2

## 2021-04-22 MED ORDER — SODIUM CHLORIDE 0.9 % IV BOLUS
1000.0000 mL | Freq: Once | INTRAVENOUS | Status: AC
  Administered 2021-04-22: 1000 mL via INTRAVENOUS

## 2021-04-22 NOTE — ED Notes (Signed)
Pt refused CT imaging -- states she is too claustrophobic; pt then refused diagnostic imaging, stating that she cannot tolerate it and wants to go home.  EDMD made aware

## 2021-04-22 NOTE — ED Provider Notes (Signed)
[facility] EMERGENCY DEPARTMENT Provider Note   CSN: [id number] Arrival date & time: 04/22/21  1202     History Chief Complaint  Patient presents with   Whitney Kelley is a 80 y.o. female.  Whitney Kelley lives alone and walks unassisted.  She has a history of alcoholism and drinks rum daily.  She is not forthcoming about the amount that she drinks, but her daughter states that she does not call her mom after 2 PM.  The patient denies drinking alcohol upon waking, but again, her history is not entirely reliable.  About 2 weeks ago, she fell.  She states that she tripped on a rug, but the daughter is skeptical of the story.  At that time, her daughter went and assessed her, and found her to have some bruising on her right lower extremity but be otherwise fine.  Burgess Estelle, was the anniversary of the patient's sons traumatic death via motorcycle crash. This fact might have prompted increased alcohol consumption.  In general, the patient denies depression, but her daughter notices that she has lost weight and has been drinking more heavily.  The patient refuses any therapeutic interventions such as counseling.  When the daughter checked on her today, she found the affected right lower extremity to be extremely swollen, bruised, and she was worried about there not being a pulse.  The patient has been ambulating on this leg.  She has arranged chairs that she can assist herself to the bathroom.  She also has several other sites of acute and chronic pain including her right chest, low back.  She has a history of COPD, is a current tobacco user, and did not have her albuterol inhaler today.  Therefore, she is endorsing some mild dyspnea.    The history is provided by the patient and a relative (daughter). The history is limited by the condition of the patient (History of alcoholism, daily intoxication, and does not remember and/or divulge details of her falls).  Fall This is a  recurrent problem. Episode onset: 2 weeks ago. Episode frequency: one episode the daughter is aware of. The problem has been rapidly worsening. Associated symptoms include chest pain. Pertinent negatives include no abdominal pain and no shortness of breath. The symptoms are aggravated by walking. Nothing relieves the symptoms. She has tried nothing for the symptoms. The treatment provided no relief.      Past Medical History:  Diagnosis Date   Acid reflux    Alcoholism (HCC)    Anemia    Anxiety    Arthritis    Asthma    Ataxia    Closed fracture of right ankle with routine healing    COPD (chronic obstructive pulmonary disease) (HCC)    Depression    Dizziness    HNP (herniated nucleus pulposus), lumbar    Lumbar stenosis    L3-4   Major depressive disorder    Pneumonia    Sleep apnea    No Cpap use   Tobacco dependence     Patient Active Problem List   Diagnosis Date Noted   Impingement syndrome of right shoulder 09/30/2018   History of lumbar laminectomy for spinal cord decompression 04/13/2018   Spinal stenosis of lumbar region 04/13/2018    Past Surgical History:  Procedure Laterality Date   ABDOMINAL HYSTERECTOMY     BACK SURGERY     BLADDER SURGERY     x3   CERVICAL SPINE SURGERY  X2   FOOT SURGERY     LUMBAR LAMINECTOMY/DECOMPRESSION MICRODISCECTOMY N/A 04/13/2018   Procedure: L3-4 CENTRAL DECOMPRESSION, MICRODISCECTOMY;  Surgeon: Eldred Manges, MD;  Location: [facility] OR;  Service: Orthopedics;  Laterality: N/A;   SPINAL FUSION     cervical x 2     OB History   No obstetric history on file.     History reviewed. No pertinent family history.  Social History   Tobacco Use   Smoking status: Every Day    Packs/day: 0.50    Types: Cigarettes   Smokeless tobacco: Never  Vaping Use   Vaping Use: Never used  Substance Use Topics   Alcohol use: Yes    Comment: daily per daughter   Drug use: Never    Home Medications Prior to Admission medications    Medication Sig Start Date End Date Taking? Authorizing Provider  albuterol (PROVENTIL HFA;VENTOLIN HFA) 108 (90 Base) MCG/ACT inhaler Inhale 2 puffs into the lungs every 4 (four) hours as needed for wheezing or shortness of breath.    [provider]  Ascorbic Acid (VITAMIN C) 1000 MG tablet Take 1,000 mg by mouth daily.    [provider]  budesonide-formoterol (SYMBICORT) 160-4.5 MCG/ACT inhaler Inhale 2 puffs into the lungs 2 (two) times daily.    [provider]  Cholecalciferol (D 1000) 1000 units capsule Take 1,000 Units by mouth daily.     [provider]  Cyanocobalamin (VITAMIN B-12) 2500 MCG SUBL Place 2,500 mcg under the tongue daily.    [provider]  methocarbamol (ROBAXIN) 500 MG tablet Take 1 tablet (500 mg total) by mouth every 6 (six) hours as needed for muscle spasms. 04/17/18   Eldred Manges, MD  nicotine (NICODERM CQ) 21 mg/24hr patch Place 1 patch (21 mg total) onto the skin daily. 04/20/18   Eldred Manges, MD  Potassium 99 MG TABS Take 99 mg by mouth daily.    [provider]  pyridOXINE (VITAMIN B-6) 100 MG tablet Take 100 mg by mouth daily.    [provider]  ranitidine (ZANTAC) 150 MG tablet Take 150 mg by mouth 2 (two) times daily.  11/19/17   [provider]  Thiamine HCl (VITAMIN B-1) 250 MG tablet Take 250 mg by mouth daily.    [provider]  traMADol (ULTRAM) 50 MG tablet Take 1 tablet (50 mg total) by mouth every 6 (six) hours as needed. 11/04/20   Geoffery Lyons, MD  venlafaxine XR (EFFEXOR-XR) 75 MG 24 hr capsule Take 75 mg by mouth 3 (three) times daily.     [provider]  vitamin E 400 UNIT capsule Take 800 Units by mouth daily.    [provider]    Allergies    Atorvastatin, Hydrocodone-acetaminophen, Amoxicillin-pot clavulanate, Diclofenac-misoprostol, and Prednisone  Review of Systems   Review of Systems  Constitutional:  Positive for unexpected weight  change. Negative for chills and fever.  HENT:  Negative for ear pain and sore throat.   Eyes:  Negative for pain and visual disturbance.  Respiratory:  Negative for cough and shortness of breath.   Cardiovascular:  Positive for chest pain. Negative for palpitations.  Gastrointestinal:  Negative for abdominal pain and vomiting.  Genitourinary:  Negative for dysuria and hematuria.  Musculoskeletal:  Positive for back pain and gait problem. Negative for arthralgias.  Skin:  Negative for color change and rash.  Neurological:  Negative for seizures and syncope.  All other systems reviewed and are negative.  Physical Exam Updated Vital Signs BP (!) 140/93 (BP Location: Right Arm)   Pulse 91   Temp 98.7 F (37.1 C) (Oral)   Resp (!) 24   Ht 5\' 4"  (1.626 m)   Wt 63.5 kg   SpO2 98%   BMI 24.03 kg/m   Physical Exam Vitals and nursing note reviewed.  Constitutional:      Appearance: She is underweight.     Comments: disheveled  HENT:     Head: Normocephalic and atraumatic.     Mouth/Throat:     Mouth: Mucous membranes are dry.     Pharynx: Oropharynx is clear.  Eyes:     Extraocular Movements: Extraocular movements intact.     Pupils: Pupils are equal, round, and reactive to light.  Cardiovascular:     Rate and Rhythm: Normal rate and regular rhythm.     Heart sounds: Normal heart sounds.  Pulmonary:     Effort: Pulmonary effort is normal. Tachypnea present.     Breath sounds: Examination of the right-upper field reveals wheezing. Examination of the left-upper field reveals wheezing. Examination of the right-middle field reveals wheezing. Examination of the left-middle field reveals wheezing. Examination of the right-lower field reveals wheezing. Examination of the left-lower field reveals wheezing. Wheezing present.  Chest:       Comments: Bruising on the anterolateral aspect of the right chest wall at the inferior aspect.  No crepitus. Abdominal:     Palpations: Abdomen is  soft.     Tenderness: There is no abdominal tenderness.  Musculoskeletal:       Arms:     Cervical back: No spinous process tenderness.     Right lower leg: No edema.     Left lower leg: No edema.     Comments: bruise at the left superior scapula  The entire right lower extremity is bruised from the medial aspect of the thigh all the way down to the foot.  The majority of the bruising is at the right lower leg, ankle, and foot.  There is swelling diffusely about the ankle but especially at the lateral malleolus.  The skin is somewhat cool, but there is a palpable dorsalis pedis and posterior tibialis pulse.  Compartments are overall soft.  She has tenderness to palpation diffusely about all of the sites of the lower leg and pain with range of motion at the ankle, knee, and hip.  She has bruising somewhat diffusely over the upper extremities but no evidence of bony deformity, joint effusions, or soft tissue swelling.  Skin:    General: Skin is warm and dry.     Comments: Appears to be extremely dehydrated.  Skin is scaly and lacks turgor.  Neurological:     General: No focal deficit present.     Mental Status: She is alert and oriented to person, place, and time.     Cranial Nerves: Cranial nerves are intact.     Sensory: Sensation is intact.     Motor: Motor function is intact.  Psychiatric:        Mood and Affect: Mood normal.        Behavior: Behavior normal.    ED Results / Procedures / Treatments   Labs (all labs ordered are listed, but only abnormal results are displayed) Labs Reviewed  CBC WITH DIFFERENTIAL/PLATELET - Abnormal; Notable for the following components:      Result Value   RBC 3.51 (*)    MCV 106.8 (*)    MCH 35.9 (*)  RDW 16.0 (*)    All other components within normal limits  COMPREHENSIVE METABOLIC PANEL - Abnormal; Notable for the following components:   Chloride 97 (*)    Creatinine, Ser <0.30 (*)    Calcium 8.4 (*)    AST 124 (*)    ALT 80 (*)     All other components within normal limits  ETHANOL - Abnormal; Notable for the following components:   Alcohol, Ethyl (B) 193 (*)    All other components within normal limits  PROTIME-INR  URINALYSIS, ROUTINE W REFLEX MICROSCOPIC  RAPID URINE DRUG SCREEN, HOSP PERFORMED  CBG MONITORING, ED    EKG EKG Interpretation  Date/Time:  Sunday April 22 2021 13:17:30 EDT Ventricular Rate:  99 PR Interval:  152 QRS Duration: 89 QT Interval:  378 QTC Calculation: 481 R Axis:   39 Text Interpretation: Sinus rhythm Atrial premature complex Consider right atrial enlargement Borderline repolarization abnormality Baseline wander in lead(s) V4 No acute ischemia Confirmed by Pieter PartridgeWright, Emani Morad ([id number]) on 04/22/2021 1:32:47 PM  Radiology DG Ribs Unilateral W/Chest Right  Result Date: 04/22/2021 CLINICAL DATA:  Fall EXAM: RIGHT RIBS AND CHEST - 3+ VIEW COMPARISON:  None. FINDINGS: Probable nondisplaced left lateral 8th rib fracture. No effusion or pneumothorax. Heart is normal size. No confluent airspace opacity. IMPRESSION: Nondisplaced left lateral 8th rib fracture. No acute cardiopulmonary disease. Electronically Signed   By: Charlett NoseKevin  Dover M.D.   On: 04/22/2021 15:28   DG Lumbar Spine Complete  Result Date: 04/22/2021 CLINICAL DATA:  Fall EXAM: LUMBAR SPINE - COMPLETE 4+ VIEW COMPARISON:  04/20/2018 FINDINGS: Advanced degenerative disc disease, most pronounced from L2-3 through L5-S1. Moderate to advanced degenerative facet disease at these levels as well. SI no subluxation. Joints symmetric no fracture and unremarkable. IMPRESSION: Advanced degenerative disc and facet disease. No acute bony abnormality. Electronically Signed   By: Charlett NoseKevin  Dover M.D.   On: 04/22/2021 15:29   DG Scapula Left  Result Date: 04/22/2021 CLINICAL DATA:  Fall 2 weeks ago EXAM: LEFT SCAPULA - 2+ VIEWS COMPARISON:  None. FINDINGS: No acute bony abnormality. Specifically, no fracture, subluxation, or dislocation. Joint spaces grossly  unremarkable. IMPRESSION: Negative. Electronically Signed   By: Charlett NoseKevin  Dover M.D.   On: 04/22/2021 15:26   DG Tibia/Fibula Right  Result Date: 04/22/2021 CLINICAL DATA:  Fall 2 weeks ago EXAM: RIGHT TIBIA AND FIBULA - 2 VIEW COMPARISON:  None. FINDINGS: There is no evidence of fracture or other focal bone lesions. Soft tissues are unremarkable. IMPRESSION: Negative. Electronically Signed   By: Charlett NoseKevin  Dover M.D.   On: 04/22/2021 15:26   DG Ankle Complete Right  Result Date: 04/22/2021 CLINICAL DATA:  Fall 2 weeks ago EXAM: RIGHT ANKLE - COMPLETE 3+ VIEW COMPARISON:  None. FINDINGS: There is no evidence of fracture, dislocation, or joint effusion. There is no evidence of arthropathy or other focal bone abnormality. Soft tissues are unremarkable. IMPRESSION: Negative. Electronically Signed   By: Charlett NoseKevin  Dover M.D.   On: 04/22/2021 15:25   CT Head Wo Contrast  Result Date: 04/22/2021 CLINICAL DATA:  Fall 2 weeks ago EXAM: CT HEAD WITHOUT CONTRAST TECHNIQUE: Contiguous axial images were obtained from the base of the skull through the vertex without intravenous contrast. COMPARISON:  05/02/2017 FINDINGS: Brain: There is atrophy and chronic small vessel disease changes. No acute intracranial abnormality. Specifically, no hemorrhage, hydrocephalus, mass lesion, acute infarction, or significant intracranial injury. 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 Vascular: No hyperdense vessel or unexpected calcification. Skull: No acute calvarial abnormality.  Sinuses/Orbits: No acute findings Other: None IMPRESSION: Atrophy, chronic microvascular disease. No acute intracranial abnormality. Electronically Signed   By: Charlett Nose M.D.   On: 04/22/2021 15:25    Procedures Procedures   Medications Ordered in ED Medications  sodium chloride 0.9 % bolus 1,000 mL (1,000 mLs Intravenous New Bag/Given 04/22/21 1426)  thiamine (B-1) injection 100 mg (100 mg Intravenous Given 04/22/21 1421)  LORazepam (ATIVAN) injection 2  mg (2 mg Intravenous Given 04/22/21 1415)    ED Course  I have reviewed the triage vital signs and the nursing notes.  Pertinent labs & imaging results that were available during my care of the patient were reviewed by me and considered in my medical decision making (see chart for details).  Clinical Course as of 04/22/21 6010  Wynelle Link Apr 22, 2021  1355 Patient refused CT scans and became quite agitated and combative.  I will give her some Ativan in case she is starting to withdraw from alcohol. [AW]    Clinical Course User Index [AW] Koleen Distance, MD   MDM Rules/Calculators/A&P                           DEYNA CARBON is 80 years old and lives alone.  She suffers from alcohol dependence.  As a result, she has fallen multiple times and has sustained multiple different injuries.  The most pronounced was her right lower leg which was almost entirely bruised from the knee down.  ED evaluation was undertaken to evaluate for evidence of trauma.  She does have evidence of a left-sided rib fracture.  She is not hypoxic.  No indication for hospitalization for this injury.  Otherwise her ankle and lower leg x-rays were negative.  She will be placed in an ASO.  I did consult case management for outpatient help setting up home physical therapy.  I spoke with the daughter at length.  Unfortunately, the patient is at risk for severe injury, illness, and decompensation secondary to her alcohol use.  The patient has no insight into her condition and is reluctant to accept help.  The daughter feels that at this time, the patient can make her own decisions.  However, she and her sister are both aware of options in case the patient becomes unable to make her own decisions.  They call and check on the patient frequently.  However, they are unable to make her comply with any treatment.  All results including mild lab abnormalities were discussed, and the patient was sent home. Final Clinical Impression(s) / ED  Diagnoses Final diagnoses:  Fall, initial encounter  Closed fracture of one rib of left side, initial encounter  Alcohol dependence with intoxication with complication (HCC)  Sprain of right ankle, unspecified ligament, initial encounter  Contusion of right knee and lower leg, initial encounter    Rx / DC Orders ED Discharge Orders     None        Koleen Distance, MD 04/22/21 1606

## 2021-04-22 NOTE — ED Triage Notes (Addendum)
Pt arrives pov with daughter, reports fall 2 weeks pta. Reports inability to get pulse in R foot, bruising to leg r/t fall c/o leg pain. Pts daughter also states pt has increased shob, hx of COPD. Daughter endorses pt drinks alcohol daily. AOx4. Pulse 2+ dorsalis pedis

## 2022-04-11 ENCOUNTER — Telehealth: Payer: Self-pay | Admitting: Internal Medicine

## 2022-04-11 NOTE — Telephone Encounter (Signed)
Spoke to the patient's sister Lynden Ang, she will speak to the patient about palliative care services and call us back. Angelique Blonder, Medical Services Coordinator-[facility] palliative care

## 2022-04-16 ENCOUNTER — Telehealth: Payer: Self-pay

## 2022-04-16 NOTE — Telephone Encounter (Signed)
Spoke with patient's sister [facility] regarding Palliative Care services. She states patient is doing well and declined services at this time. She states she will reach out if needed I the future. Will cancel referral and notify referring provider.

## 2022-04-16 NOTE — Telephone Encounter (Signed)
Attempted to contact patient to schedule a Palliative Care consult appointment. No answer left a message to return call.
